# Patient Record
Sex: Female | Born: 2000 | Race: White | Hispanic: No | Marital: Married | Smoking: Never smoker
Health system: Southern US, Community
[De-identification: ages and names within clinical notes are randomized; demographics above are authoritative.]

## PROBLEM LIST (undated history)

## (undated) ENCOUNTER — Inpatient Hospital Stay (HOSPITAL_COMMUNITY): Payer: Self-pay

## (undated) ENCOUNTER — Inpatient Hospital Stay: Payer: Self-pay

## (undated) DIAGNOSIS — E119 Type 2 diabetes mellitus without complications: Secondary | ICD-10-CM

## (undated) DIAGNOSIS — R718 Other abnormality of red blood cells: Secondary | ICD-10-CM

## (undated) DIAGNOSIS — O24419 Gestational diabetes mellitus in pregnancy, unspecified control: Secondary | ICD-10-CM

## (undated) DIAGNOSIS — J302 Other seasonal allergic rhinitis: Secondary | ICD-10-CM

## (undated) DIAGNOSIS — Z5189 Encounter for other specified aftercare: Secondary | ICD-10-CM

## (undated) DIAGNOSIS — Z789 Other specified health status: Secondary | ICD-10-CM

## (undated) DIAGNOSIS — O09299 Supervision of pregnancy with other poor reproductive or obstetric history, unspecified trimester: Secondary | ICD-10-CM

## (undated) HISTORY — PX: OTHER SURGICAL HISTORY: SHX169

## (undated) HISTORY — DX: Gestational diabetes mellitus in pregnancy, unspecified control: O24.419

## (undated) HISTORY — DX: Other specified health status: Z78.9

## (undated) HISTORY — DX: Type 2 diabetes mellitus without complications: E11.9

## (undated) HISTORY — DX: Supervision of pregnancy with other poor reproductive or obstetric history, unspecified trimester: O09.299

## (undated) HISTORY — DX: Other abnormality of red blood cells: R71.8

## (undated) HISTORY — PX: NO PAST SURGERIES: SHX2092

## (undated) HISTORY — DX: Encounter for other specified aftercare: Z51.89

---
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
## 2019-01-05 ENCOUNTER — Ambulatory Visit: Payer: Medicaid Other | Admitting: Women's Health

## 2019-03-30 ENCOUNTER — Encounter: Payer: Self-pay | Admitting: Emergency Medicine

## 2019-03-30 ENCOUNTER — Other Ambulatory Visit: Payer: Medicaid Other

## 2019-03-30 ENCOUNTER — Telehealth: Payer: Self-pay | Admitting: Obstetrics and Gynecology

## 2019-03-30 ENCOUNTER — Emergency Department
Admission: EM | Admit: 2019-03-30 | Discharge: 2019-03-30 | Disposition: A | Discharge status: Home / Self Care | Payer: Medicaid Other | Attending: Emergency Medicine | Admitting: Emergency Medicine

## 2019-03-30 ENCOUNTER — Other Ambulatory Visit: Payer: Self-pay

## 2019-03-30 ENCOUNTER — Telehealth: Payer: Self-pay | Admitting: *Deleted

## 2019-03-30 DIAGNOSIS — Z7722 Contact with and (suspected) exposure to environmental tobacco smoke (acute) (chronic): Secondary | ICD-10-CM | POA: Insufficient documentation

## 2019-03-30 DIAGNOSIS — R300 Dysuria: Secondary | ICD-10-CM | POA: Diagnosis not present

## 2019-03-30 DIAGNOSIS — R350 Frequency of micturition: Secondary | ICD-10-CM | POA: Diagnosis present

## 2019-03-30 DIAGNOSIS — Z79899 Other long term (current) drug therapy: Secondary | ICD-10-CM | POA: Insufficient documentation

## 2019-03-30 DIAGNOSIS — N390 Urinary tract infection, site not specified: Secondary | ICD-10-CM

## 2019-03-30 LAB — URINALYSIS, COMPLETE (UACMP) WITH MICROSCOPIC
Bacteria, UA: NONE SEEN
Bilirubin Urine: NEGATIVE
Glucose, UA: NEGATIVE mg/dL
Ketones, ur: NEGATIVE mg/dL
Nitrite: NEGATIVE
Protein, ur: 100 mg/dL — AB
RBC / HPF: >50 RBC/hpf — ABNORMAL HIGH (ref 0–5)
Specific Gravity, Urine: 1.03 (ref 1.005–1.030)
WBC, UA: >50 WBC/hpf — ABNORMAL HIGH (ref 0–5)
pH: 6 (ref 5.0–8.0)

## 2019-03-30 MED ORDER — CEPHALEXIN 500 MG PO CAPS: 500.0000 mg | ORAL_CAPSULE | Freq: Once | ORAL | Status: DC

## 2019-03-30 MED ORDER — CEPHALEXIN 500 MG PO CAPS: 500.0000 mg | ORAL_CAPSULE | Freq: Two times a day (BID) | ORAL | 0 refills | Status: AC

## 2019-03-30 NOTE — ED Provider Notes (Signed)
[facility] Emergency Department Provider Note  ____________________________________________  Time seen: Approximately 1:51 PM  I have reviewed the triage vital signs and the nursing notes.   HISTORY  Chief Complaint Urinary Frequency and Dysuria    HPI Ann Hopkins is a 18 y.o. female that presents to the emergency department for evaluation of urinary urgency, frequency, dysuria for 2 days.  Patient states that she has a history of urinary tract infections and this feels the same.  She is currently on her menstrual cycle.  She is having some of her normal menstrual abdominal cramping.  No fevers.  No vaginal discharge.   Past Medical History:  Diagnosis Date  . Medical history non-contributory   . Seasonal allergies     Patient Active Problem List   Diagnosis Date Noted  . Normal labor 07/10/2018  . Indication for care in labor and delivery, antepartum 07/03/2018  . Back pain affecting pregnancy in third trimester 06/29/2018  . Rh negative state in antepartum period 12/10/2017  . Asymptomatic bacteriuria during pregnancy in first trimester 12/08/2017    Past Surgical History:  Procedure Laterality Date  . childbirth    . NO PAST SURGERIES      Prior to Admission medications   Medication Sig Start Date End Date Taking? Authorizing Provider  cephALEXin (KEFLEX) 500 MG capsule Take 1 capsule (500 mg total) by mouth 2 (two) times daily for 7 days. 03/30/19 04/06/19  Laban Emperor, PA-C  megestrol (MEGACE) 40 MG tablet 3 tablets a day for 5 days, 2 tablets a day for 5 days then 1 tablet daily 12/08/18   Florian Buff, MD    Allergies Latex  Family History  Problem Relation Age of Onset  . Cancer Other   . Cancer Maternal Grandfather   . Diabetes Maternal Grandfather     Social History Social History   Tobacco Use  . Smoking status: Passive Smoke Exposure - Never Smoker  . Smokeless tobacco: Never Used  Substance Use Topics  .  Alcohol use: No  . Drug use: No     Review of Systems  Constitutional: No fever/chills Respiratory: No SOB. Gastrointestinal: No nausea, no vomiting.  Genitourinary: Positive for dysuria. Musculoskeletal: Negative for musculoskeletal pain. Skin: Negative for rash, abrasions, lacerations, ecchymosis.   ____________________________________________   PHYSICAL EXAM:  VITAL SIGNS: ED Triage Vitals  Enc Vitals Group     BP 03/30/19 1243 115/63     Pulse Rate 03/30/19 1243 85     Resp 03/30/19 1243 20     Temp 03/30/19 1243 98 F (36.7 C)     Temp Source 03/30/19 1243 Oral     SpO2 03/30/19 1243 100 %     Weight 03/30/19 1244 126 lb (57.2 kg)     Height 03/30/19 1244 5' (1.524 m)     Head Circumference --      Peak Flow --      Pain Score 03/30/19 1244 0     Pain Loc --      Pain Edu? --      Excl. in [place]? --      Constitutional: Alert and oriented. Well appearing and in no acute distress. Eyes: Conjunctivae are normal. PERRL. EOMI. Head: Atraumatic. ENT:      Ears:      Nose: No congestion/rhinnorhea.      Mouth/Throat: Mucous membranes are moist.  Neck: No stridor. Cardiovascular: Normal rate, regular rhythm.  Good peripheral circulation. Respiratory: Normal respiratory effort without  tachypnea or retractions. Lungs CTAB. Good air entry to the bases with no decreased or absent breath sounds. Musculoskeletal: Full range of motion to all extremities. No gross deformities appreciated. Neurologic:  Normal speech and language. No gross focal neurologic deficits are appreciated.  Skin:  Skin is warm, dry and intact. No rash noted. Psychiatric: Mood and affect are normal. Speech and behavior are normal. Patient exhibits appropriate insight and judgement.   ____________________________________________   LABS (all labs ordered are listed, but only abnormal results are displayed)  Labs Reviewed  URINALYSIS, COMPLETE (UACMP) WITH MICROSCOPIC - Abnormal; Notable for the  following components:      Result Value   Color, Urine YELLOW (*)    APPearance HAZY (*)    Hgb urine dipstick LARGE (*)    Protein, ur 100 (*)    Leukocytes,Ua MODERATE (*)    RBC / HPF >50 (*)    WBC, UA >50 (*)    All other components within normal limits   ____________________________________________  EKG   ____________________________________________  RADIOLOGY   No results found.  ____________________________________________    PROCEDURES  Procedure(s) performed:    Procedures    Medications  cephALEXin (KEFLEX) capsule 500 mg (has no administration in time range)     ____________________________________________   INITIAL IMPRESSION / ASSESSMENT AND PLAN / ED COURSE  Pertinent labs & imaging results that were available during my care of the patient were reviewed by me and considered in my medical decision making (see chart for details).  Review of the [place] CSRS was performed in accordance of the [facility] prior to dispensing any controlled drugs.   Patient's diagnosis is consistent with urinary tract infection.  Urinalysis consistent with infection.  Patient will be discharged home with prescriptions for Keflex.  Patient is to follow up with primary care as directed. Patient is given ED precautions to return to the ED for any worsening or new symptoms.   [name] was evaluated in Emergency Department on 03/30/2019 for the symptoms described in the history of present illness. She was evaluated in the context of the global COVID-19 pandemic, which necessitated consideration that the patient might be at risk for infection with the SARS-CoV-2 virus that causes COVID-19. Institutional protocols and algorithms that pertain to the evaluation of patients at risk for COVID-19 are in a state of rapid change based on information released by regulatory bodies including the CDC and federal and state organizations. These policies and algorithms were followed during the  patient's care in the ED.  ____________________________________________  FINAL CLINICAL IMPRESSION(S) / ED DIAGNOSES  Final diagnoses:  Lower urinary tract infectious disease      NEW MEDICATIONS STARTED DURING THIS VISIT:  ED Discharge Orders         Ordered    cephALEXin (KEFLEX) 500 MG capsule  2 times daily     03/30/19 1352              This chart was dictated using voice recognition software/Dragon. Despite best efforts to proofread, errors can occur which can change the meaning. Any change was purely unintentional.    Enid Derry, PA-C 03/30/19 1454    Dionne Bucy, MD 03/30/19 1544

## 2019-03-30 NOTE — Telephone Encounter (Addendum)
Tried to call patient back. Voicemail is full.

## 2019-03-30 NOTE — Telephone Encounter (Signed)
Pt called and stated that she needs to be seen today. The patient stated that she has a UTI. Please advise.

## 2019-03-30 NOTE — Telephone Encounter (Signed)
Pt reports urinary frequency and burning.

## 2019-03-30 NOTE — ED Triage Notes (Signed)
Pt reports symptoms of a UTI since yesterday. Pt states has had one before and knows what it feels like.

## 2019-04-01 ENCOUNTER — Emergency Department (HOSPITAL_COMMUNITY)
Admission: EM | Admit: 2019-04-01 | Discharge: 2019-04-02 | Disposition: A | Discharge status: Home / Self Care | Payer: Medicaid Other | Attending: Emergency Medicine | Admitting: Emergency Medicine

## 2019-04-01 ENCOUNTER — Encounter (HOSPITAL_COMMUNITY): Payer: Self-pay | Admitting: Emergency Medicine

## 2019-04-01 ENCOUNTER — Other Ambulatory Visit: Payer: Self-pay

## 2019-04-01 DIAGNOSIS — Z7722 Contact with and (suspected) exposure to environmental tobacco smoke (acute) (chronic): Secondary | ICD-10-CM | POA: Diagnosis not present

## 2019-04-01 DIAGNOSIS — Z3202 Encounter for pregnancy test, result negative: Secondary | ICD-10-CM | POA: Diagnosis not present

## 2019-04-01 DIAGNOSIS — Z79899 Other long term (current) drug therapy: Secondary | ICD-10-CM | POA: Diagnosis not present

## 2019-04-01 DIAGNOSIS — R112 Nausea with vomiting, unspecified: Secondary | ICD-10-CM | POA: Insufficient documentation

## 2019-04-01 DIAGNOSIS — Z91040 Latex allergy status: Secondary | ICD-10-CM | POA: Diagnosis not present

## 2019-04-01 LAB — URINALYSIS, ROUTINE W REFLEX MICROSCOPIC
Bacteria, UA: NONE SEEN
Bilirubin Urine: NEGATIVE
Glucose, UA: NEGATIVE mg/dL
Hgb urine dipstick: NEGATIVE
Ketones, ur: NEGATIVE mg/dL
Nitrite: NEGATIVE
Protein, ur: NEGATIVE mg/dL
Specific Gravity, Urine: 1.027 (ref 1.005–1.030)
pH: 7 (ref 5.0–8.0)

## 2019-04-01 LAB — CBC
HCT: 41.7 % (ref 36.0–46.0)
Hemoglobin: 13.3 g/dL (ref 12.0–15.0)
MCH: 27.9 pg (ref 26.0–34.0)
MCHC: 31.9 g/dL (ref 30.0–36.0)
MCV: 87.4 fL (ref 80.0–100.0)
Platelets: 220 10*3/uL (ref 150–400)
RBC: 4.77 MIL/uL (ref 3.87–5.11)
RDW: 13 % (ref 11.5–15.5)
WBC: 12.7 10*3/uL — ABNORMAL HIGH (ref 4.0–10.5)
nRBC: 0 % (ref 0.0–0.2)

## 2019-04-01 LAB — COMPREHENSIVE METABOLIC PANEL
ALT: 12 U/L (ref 0–44)
AST: 16 U/L (ref 15–41)
Albumin: 4.3 g/dL (ref 3.5–5.0)
Alkaline Phosphatase: 59 U/L (ref 38–126)
Anion gap: 9 (ref 5–15)
BUN: 14 mg/dL (ref 6–20)
CO2: 21 mmol/L — ABNORMAL LOW (ref 22–32)
Calcium: 9 mg/dL (ref 8.9–10.3)
Chloride: 106 mmol/L (ref 98–111)
Creatinine, Ser: 0.48 mg/dL (ref 0.44–1.00)
GFR calc Af Amer: >60 mL/min (ref >60)
GFR calc non Af Amer: >60 mL/min (ref >60)
Glucose, Bld: 97 mg/dL (ref 70–99)
Potassium: 3.7 mmol/L (ref 3.5–5.1)
Sodium: 136 mmol/L (ref 135–145)
Total Bilirubin: 0.4 mg/dL (ref 0.3–1.2)
Total Protein: 7.9 g/dL (ref 6.5–8.1)

## 2019-04-01 LAB — LIPASE, BLOOD: Lipase: 27 U/L (ref 11–51)

## 2019-04-01 LAB — POC URINE PREG, ED: Preg Test, Ur: NEGATIVE

## 2019-04-01 MED ORDER — ONDANSETRON HCL 4 MG/2ML IJ SOLN
4.0000 mg | Freq: Once | INTRAMUSCULAR | Status: AC
  Administered 2019-04-01: 4 mg via INTRAVENOUS
  Filled 2019-04-01: qty 2

## 2019-04-01 MED ORDER — SODIUM CHLORIDE 0.9% FLUSH
3.0000 mL | Freq: Once | INTRAVENOUS | Status: AC
  Administered 2019-04-01: 3 mL via INTRAVENOUS

## 2019-04-01 NOTE — ED Provider Notes (Signed)
[facility] EMERGENCY DEPARTMENT Provider Note   CSN: [id number] Arrival date & time: 04/01/19  2053   Time seen 11:28 PM  History   Chief Complaint Chief Complaint  Patient presents with  . Emesis    HPI Ann Hopkins is a 18 y.o. female.     HPI was seen in the ED 2 days ago for a urinary tract infection and was started on cephalexin.  She states her symptoms are improving however today she ate at 6 AM and did not eat all day while she was taking her antibiotics.  She states about 8 PM she started getting nauseated and felt like she was going to pass out.  She states she vomited x3 without diarrhea.  She denies any abdominal pain.  She states her nausea is gone now and she actually ate some chicken tenders on the way to the ED.  She states today's the first day she did not eat while taking the antibiotics.  She denies any fever.  She actually states she feels good enough to be discharged home now.  Patient also states her symptoms she had with a urinary tract infection are improving.  PCP Patient, No Pcp Per   Past Medical History:  Diagnosis Date  . Medical history non-contributory   . Seasonal allergies     Patient Active Problem List   Diagnosis Date Noted  . Normal labor 07/10/2018  . Indication for care in labor and delivery, antepartum 07/03/2018  . Back pain affecting pregnancy in third trimester 06/29/2018  . Rh negative state in antepartum period 12/10/2017  . Asymptomatic bacteriuria during pregnancy in first trimester 12/08/2017    Past Surgical History:  Procedure Laterality Date  . childbirth    . NO PAST SURGERIES       OB History    Gravida  1   Para  1   Term  1   Preterm      AB      Living  1     SAB      TAB      Ectopic      Multiple  0   Live Births  1            Home Medications    Prior to Admission medications   Medication Sig Start Date End Date Taking? Authorizing Provider  cephALEXin (KEFLEX) 500 MG  capsule Take 1 capsule (500 mg total) by mouth 2 (two) times daily for 7 days. 03/30/19 04/06/19  Enid DerryWagner, Ashley, PA-C  megestrol (MEGACE) 40 MG tablet 3 tablets a day for 5 days, 2 tablets a day for 5 days then 1 tablet daily 12/08/18   Lazaro ArmsEure, Luther H, MD    Family History Family History  Problem Relation Age of Onset  . Cancer Other   . Cancer Maternal Grandfather   . Diabetes Maternal Grandfather     Social History Social History   Tobacco Use  . Smoking status: Passive Smoke Exposure - Never Smoker  . Smokeless tobacco: Never Used  Substance Use Topics  . Alcohol use: No  . Drug use: No  employed   Allergies   Latex   Review of Systems Review of Systems  All other systems reviewed and are negative.    Physical Exam Updated Vital Signs BP 116/68   Pulse 78   Temp 98.2 F (36.8 C) (Oral)   Resp 15   LMP 03/29/2019 (Exact Date)   SpO2 97%   Physical Exam  Vitals signs and nursing note reviewed.  Constitutional:      General: She is not in acute distress.    Appearance: Normal appearance. She is normal weight.  HENT:     Head: Normocephalic and atraumatic.     Right Ear: External ear normal.     Left Ear: External ear normal.     Mouth/Throat:     Mouth: Mucous membranes are moist.     Pharynx: No oropharyngeal exudate or posterior oropharyngeal erythema.  Eyes:     Extraocular Movements: Extraocular movements intact.     Conjunctiva/sclera: Conjunctivae normal.     Pupils: Pupils are equal, round, and reactive to light.  Neck:     Musculoskeletal: Normal range of motion.  Cardiovascular:     Rate and Rhythm: Normal rate.     Pulses: Normal pulses.     Heart sounds: Normal heart sounds.  Pulmonary:     Effort: Pulmonary effort is normal. No respiratory distress.     Breath sounds: No stridor. No wheezing, rhonchi or rales.  Abdominal:     General: Abdomen is flat. Bowel sounds are normal.     Palpations: Abdomen is soft.     Tenderness: There is no  abdominal tenderness. There is no guarding or rebound.  Musculoskeletal: Normal range of motion.        General: No deformity.  Skin:    General: Skin is warm and dry.     Findings: No erythema.  Neurological:     General: No focal deficit present.     Mental Status: She is alert and oriented to person, place, and time.     Cranial Nerves: No cranial nerve deficit.  Psychiatric:        Mood and Affect: Mood normal.        Behavior: Behavior normal.        Thought Content: Thought content normal.      ED Treatments / Results  Labs (all labs ordered are listed, but only abnormal results are displayed) Results for orders placed or performed during the hospital encounter of 04/01/19  Lipase, blood  Result Value Ref Range   Lipase 27 11 - 51 U/L  Comprehensive metabolic panel  Result Value Ref Range   Sodium 136 135 - 145 mmol/L   Potassium 3.7 3.5 - 5.1 mmol/L   Chloride 106 98 - 111 mmol/L   CO2 21 (L) 22 - 32 mmol/L   Glucose, Bld 97 70 - 99 mg/dL   BUN 14 6 - 20 mg/dL   Creatinine, Ser 0.48 0.44 - 1.00 mg/dL   Calcium 9.0 8.9 - 10.3 mg/dL   Total Protein 7.9 6.5 - 8.1 g/dL   Albumin 4.3 3.5 - 5.0 g/dL   AST 16 15 - 41 U/L   ALT 12 0 - 44 U/L   Alkaline Phosphatase 59 38 - 126 U/L   Total Bilirubin 0.4 0.3 - 1.2 mg/dL   GFR calc non Af Amer >60 >60 mL/min   GFR calc Af Amer >60 >60 mL/min   Anion gap 9 5 - 15  CBC  Result Value Ref Range   WBC 12.7 (H) 4.0 - 10.5 K/uL   RBC 4.77 3.87 - 5.11 MIL/uL   Hemoglobin 13.3 12.0 - 15.0 g/dL   HCT 41.7 36.0 - 46.0 %   MCV 87.4 80.0 - 100.0 fL   MCH 27.9 26.0 - 34.0 pg   MCHC 31.9 30.0 - 36.0 g/dL   RDW 13.0 11.5 -  15.5 %   Platelets 220 150 - 400 K/uL   nRBC 0.0 0.0 - 0.2 %  Urinalysis, Routine w reflex microscopic  Result Value Ref Range   Color, Urine YELLOW YELLOW   APPearance HAZY (A) CLEAR   Specific Gravity, Urine 1.027 1.005 - 1.030   pH 7.0 5.0 - 8.0   Glucose, UA NEGATIVE NEGATIVE mg/dL   Hgb urine dipstick  NEGATIVE NEGATIVE   Bilirubin Urine NEGATIVE NEGATIVE   Ketones, ur NEGATIVE NEGATIVE mg/dL   Protein, ur NEGATIVE NEGATIVE mg/dL   Nitrite NEGATIVE NEGATIVE   Leukocytes,Ua TRACE (A) NEGATIVE   RBC / HPF 0-5 0 - 5 RBC/hpf   WBC, UA 11-20 0 - 5 WBC/hpf   Bacteria, UA NONE SEEN NONE SEEN   Squamous Epithelial / LPF 11-20 0 - 5   Mucus PRESENT   POC urine preg, ED  Result Value Ref Range   Preg Test, Ur NEGATIVE NEGATIVE   Laboratory interpretation all normal except leukocytosis consistent with history of vomiting, improving urinalysis results compared to 2 days ago.    EKG None  Radiology No results found.  Procedures Procedures (including critical care time)  Medications Ordered in ED Medications  sodium chloride flush (NS) 0.9 % injection 3 mL (3 mLs Intravenous Given 04/01/19 2247)  ondansetron (ZOFRAN) injection 4 mg (4 mg Intravenous Given 04/01/19 2251)     Initial Impression / Assessment and Plan / ED Course  I have reviewed the triage vital signs and the nursing notes.  Pertinent labs & imaging results that were available during my care of the patient were reviewed by me and considered in my medical decision making (see chart for details).      Patient presents with nausea and vomiting while taking antibiotics on an empty stomach all day.  She states she was able to eat on the way to the ED and had received Zofran IV in the ED.  She states she feels fine now and does not want to get IV fluids.  She states she feels like she is able to orally drink fluids on her own.  She feels like she is able to go home now.  We discussed her test results which were very reassuring and show that her UTI symptoms and her urinalysis results are improving on antibiotics.   Final Clinical Impressions(s) / ED Diagnoses   Final diagnoses:  Non-intractable vomiting with nausea, unspecified vomiting type    ED Discharge Orders    None     Plan discharge  Devoria Albe, MD, Concha Pyo, MD 04/01/19 [phone]

## 2019-04-01 NOTE — Discharge Instructions (Addendum)
Drink plenty of fluids. Try to eat something when you take your antibiotics. Finish your antibiotics. Recheck if you get worse.

## 2019-04-01 NOTE — ED Triage Notes (Signed)
Pt C/O lightheadedness and emesis X 3 that started 2 hours ago. Pt denies pain.

## 2019-04-02 NOTE — ED Notes (Signed)
Pt ambulatory to waiting room. Pt verbalized understanding of discharge instructions.   

## 2019-05-03 ENCOUNTER — Encounter (HOSPITAL_COMMUNITY): Payer: Self-pay | Admitting: Emergency Medicine

## 2019-05-03 ENCOUNTER — Emergency Department (HOSPITAL_COMMUNITY)
Admission: EM | Admit: 2019-05-03 | Discharge: 2019-05-03 | Disposition: A | Discharge status: Home / Self Care | Payer: Medicaid Other | Attending: Emergency Medicine | Admitting: Emergency Medicine

## 2019-05-03 ENCOUNTER — Other Ambulatory Visit: Payer: Self-pay

## 2019-05-03 DIAGNOSIS — Z7722 Contact with and (suspected) exposure to environmental tobacco smoke (acute) (chronic): Secondary | ICD-10-CM | POA: Diagnosis not present

## 2019-05-03 DIAGNOSIS — R300 Dysuria: Secondary | ICD-10-CM | POA: Insufficient documentation

## 2019-05-03 DIAGNOSIS — R109 Unspecified abdominal pain: Secondary | ICD-10-CM | POA: Diagnosis present

## 2019-05-03 DIAGNOSIS — Z91040 Latex allergy status: Secondary | ICD-10-CM | POA: Insufficient documentation

## 2019-05-03 DIAGNOSIS — N12 Tubulo-interstitial nephritis, not specified as acute or chronic: Secondary | ICD-10-CM | POA: Diagnosis not present

## 2019-05-03 LAB — URINALYSIS, ROUTINE W REFLEX MICROSCOPIC
Bilirubin Urine: NEGATIVE
Glucose, UA: NEGATIVE mg/dL
Ketones, ur: NEGATIVE mg/dL
Nitrite: NEGATIVE
Protein, ur: 30 mg/dL — AB
RBC / HPF: >50 RBC/hpf — ABNORMAL HIGH (ref 0–5)
Specific Gravity, Urine: 1.015 (ref 1.005–1.030)
WBC, UA: >50 WBC/hpf — ABNORMAL HIGH (ref 0–5)
pH: 6 (ref 5.0–8.0)

## 2019-05-03 LAB — POC URINE PREG, ED: Preg Test, Ur: NEGATIVE

## 2019-05-03 MED ORDER — CEPHALEXIN 500 MG PO CAPS: 500.0000 mg | ORAL_CAPSULE | Freq: Three times a day (TID) | ORAL | 0 refills | Status: DC

## 2019-05-03 MED ORDER — NAPROXEN 500 MG PO TABS: 500.0000 mg | ORAL_TABLET | Freq: Two times a day (BID) | ORAL | 0 refills | Status: DC

## 2019-05-03 NOTE — ED Provider Notes (Signed)
[facility] EMERGENCY DEPARTMENT Provider Note   CSN: [id number] Arrival date & time: 05/03/19  1015     History   Chief Complaint Chief Complaint  Patient presents with  . Flank Pain    HPI Ann Hopkins is a 18 y.o. female who presents with right flank pain.  Patient states that she was diagnosed with a UTI on October 27 when she went to [facility].  She was prescribed Keflex. She came back to the ED on Oct 29th after vomiting and this was felt to be related to not taking the medication with food. Soon after she lost the medication in a move and the dysuria returned and been persistent for the past month.  Last night she started to develop right-sided flank pain.  Pain is worsened today she was never able to work therefore she came to the ED.  She denies fever, chills, chest pain, shortness of breath, cough, nausea, vomiting, diarrhea.   HPI  Past Medical History:  Diagnosis Date  . Medical history non-contributory   . Seasonal allergies     Patient Active Problem List   Diagnosis Date Noted  . Normal labor 07/10/2018  . Indication for care in labor and delivery, antepartum 07/03/2018  . Back pain affecting pregnancy in third trimester 06/29/2018  . Rh negative state in antepartum period 12/10/2017  . Asymptomatic bacteriuria during pregnancy in first trimester 12/08/2017    Past Surgical History:  Procedure Laterality Date  . childbirth    . NO PAST SURGERIES       OB History    Gravida  1   Para  1   Term  1   Preterm      AB      Living  1     SAB      TAB      Ectopic      Multiple  0   Live Births  1            Home Medications    Prior to Admission medications   Medication Sig Start Date End Date Taking? Authorizing Provider  megestrol (MEGACE) 40 MG tablet 3 tablets a day for 5 days, 2 tablets a day for 5 days then 1 tablet daily 12/08/18   Lazaro Arms, MD    Family History Family History  Problem Relation  Age of Onset  . Cancer Other   . Cancer Maternal Grandfather   . Diabetes Maternal Grandfather     Social History Social History   Tobacco Use  . Smoking status: Passive Smoke Exposure - Never Smoker  . Smokeless tobacco: Never Used  Substance Use Topics  . Alcohol use: No  . Drug use: No     Allergies   Latex   Review of Systems Review of Systems  Constitutional: Negative for chills and fever.  Gastrointestinal: Negative for abdominal pain, diarrhea, nausea and vomiting.  Genitourinary: Positive for dysuria and flank pain. Negative for hematuria and pelvic pain.  All other systems reviewed and are negative.    Physical Exam Updated Vital Signs BP 118/69 (BP Location: Right Arm)   Pulse 100   Temp 98.4 F (36.9 C) (Oral)   Resp 16   Ht 5' (1.524 m)   Wt 54.4 kg   LMP 04/25/2019   SpO2 98%   BMI 23.44 kg/m   Physical Exam Vitals signs and nursing note reviewed.  Constitutional:      General: She is not in  acute distress.    Appearance: Normal appearance. She is well-developed. She is not ill-appearing.  HENT:     Head: Normocephalic and atraumatic.  Eyes:     General: No scleral icterus.       Right eye: No discharge.        Left eye: No discharge.     Conjunctiva/sclera: Conjunctivae normal.     Pupils: Pupils are equal, round, and reactive to light.  Neck:     Musculoskeletal: Normal range of motion.  Cardiovascular:     Rate and Rhythm: Normal rate and regular rhythm.  Pulmonary:     Effort: Pulmonary effort is normal. No respiratory distress.     Breath sounds: Normal breath sounds.  Abdominal:     General: There is no distension.     Palpations: Abdomen is soft.     Tenderness: There is abdominal tenderness (Right lower quadrant tenderness). There is right CVA tenderness.  Skin:    General: Skin is warm and dry.  Neurological:     Mental Status: She is alert and oriented to person, place, and time.  Psychiatric:        Behavior: Behavior  normal.      ED Treatments / Results  Labs (all labs ordered are listed, but only abnormal results are displayed) Labs Reviewed  URINALYSIS, ROUTINE W REFLEX MICROSCOPIC - Abnormal; Notable for the following components:      Result Value   APPearance HAZY (*)    Hgb urine dipstick MODERATE (*)    Protein, ur 30 (*)    Leukocytes,Ua LARGE (*)    RBC / HPF >50 (*)    WBC, UA >50 (*)    Bacteria, UA RARE (*)    All other components within normal limits  URINE CULTURE  POC URINE PREG, ED    EKG None  Radiology No results found.  Procedures Procedures (including critical care time)  Medications Ordered in ED Medications - No data to display   Initial Impression / Assessment and Plan / ED Course  I have reviewed the triage vital signs and the nursing notes.  Pertinent labs & imaging results that were available during my care of the patient were reviewed by me and considered in my medical decision making (see chart for details).  18 year old female presents with urinary symptoms for a month and now right flank pain after inadequate treatment for UTI approximately 1 month.  She is initially tachycardic however this is resolved on recheck.  She has right CVA tenderness and right lower quadrant tenderness on exam.  UA shows large leukocytes, over 50 red blood cells, over 50 white blood cells, rare bacteria.  We will send off urine culture.  Will treat for pyelonephritis.  Low suspicion for PID or appendicitis at this time with clear UTI symptoms.  She was given return precautions.  Final Clinical Impressions(s) / ED Diagnoses   Final diagnoses:  Pyelonephritis    ED Discharge Orders    None       Recardo Evangelist, PA-C 05/03/19 1434    Fredia Sorrow, MD 05/05/19 [phone]

## 2019-05-03 NOTE — Discharge Instructions (Signed)
Take Keflex three times daily for 10 days Take Naproxen for pain  Please return if you are worsening

## 2019-05-03 NOTE — ED Triage Notes (Signed)
Pt states she had uti several weeks ago and lost her abx. Started having right flank pain with uti s/s last night. Denies fever.

## 2019-05-06 LAB — URINE CULTURE
Culture: >=100000 — AB
Special Requests: NORMAL

## 2019-05-07 ENCOUNTER — Telehealth: Payer: Self-pay | Admitting: *Deleted

## 2019-05-07 NOTE — Telephone Encounter (Signed)
Post ED Visit - Positive Culture Follow-up: Successful Patient Follow-Up  Culture assessed and recommendations reviewed by:  []  Elenor Quinones, Pharm.D. []  Heide Guile, Pharm.D., BCPS AQ-ID []  Parks Neptune, Pharm.D., BCPS []  Alycia Rossetti, Pharm.D., BCPS []  [place], Pharm.D., BCPS, AAHIVP []  Legrand Como, Pharm.D., BCPS, AAHIVP []  Salome Arnt, PharmD, BCPS []  Johnnette Gourd, PharmD, BCPS []  Hughes Better, PharmD, BCPS []  Leeroy Cha, PharmD Gorden Harms, Pharm D  Positive urine culture  []  Patient discharged without antimicrobial prescription and treatment is now indicated [x]  Organism is resistant to prescribed ED discharge antimicrobial []  Patient with positive blood cultures  Changes discussed with ED provider Benedetto Goad, PA-C New antibiotic prescription Bactrim DS BID x 10 days Called to [place], [place]  Contacted patient, date 05/07/2019, time [place], [place] 05/07/2019, 10:22 AM

## 2019-05-07 NOTE — Progress Notes (Signed)
ED Antimicrobial Stewardship Positive Culture Follow Up   Ann Hopkins is an 18 y.o. female who presented to [facility] on 05/03/2019 with a chief complaint of  Chief Complaint  Patient presents with  . Flank Pain   Presenting with R flank pain. Recently treated with keflex for UTI on Oct 27. Still having continued dysuria. UA showing large leukocytes, neg nitrite, WBC >50, and rare bacteria.   Recent Results (from the past 720 hour(s))  Urine culture     Status: Abnormal   Collection Time: 05/03/19 10:44 AM   Specimen: Urine, Random  Result Value Ref Range Status   Specimen Description   Final    URINE, RANDOM Performed at [facility], [address] [place], [place] [address]    Special Requests   Final    Normal Performed at [facility], [address], [place], [place] [address]    Culture >=100,000 COLONIES/mL STAPHYLOCOCCUS SAPROPHYTICUS (A)  Final   Report Status 05/06/2019 FINAL  Final   Organism ID, Bacteria STAPHYLOCOCCUS SAPROPHYTICUS (A)  Final      Susceptibility   Staphylococcus saprophyticus - MIC*    CIPROFLOXACIN <=0.5 SENSITIVE Sensitive     GENTAMICIN <=0.5 SENSITIVE Sensitive     NITROFURANTOIN <=16 SENSITIVE Sensitive     OXACILLIN 2 RESISTANT Resistant     TETRACYCLINE <=1 SENSITIVE Sensitive     VANCOMYCIN <=0.5 SENSITIVE Sensitive     TRIMETH/SULFA <=10 SENSITIVE Sensitive     CLINDAMYCIN <=0.25 SENSITIVE Sensitive     RIFAMPIN <=0.5 SENSITIVE Sensitive     Inducible Clindamycin NEGATIVE Sensitive     * >=100,000 COLONIES/mL STAPHYLOCOCCUS SAPROPHYTICUS    [x]  Treated with cephalexin, organism resistant to prescribed antimicrobial []  Patient discharged originally without antimicrobial agent and treatment is now indicated  New antibiotic prescription: Stop cephalexin and change to Bactrim 1 double-strength (400-80 mg) twice daily for 10 days.   ED Provider: Benedetto Goad, PA-C   Antonietta Jewel, PharmD, BCCCP Clinical Pharmacist  Phone:  [phone]  Please check AMION for all [place] phone numbers After 10:00 PM, call [place] [phone] 05/07/2019, 9:28 AM Clinical Pharmacist Monday - Friday phone -  [phone] Saturday - Sunday phone - [phone]

## 2019-05-07 NOTE — Telephone Encounter (Signed)
Post ED Visit - Positive Culture Follow-up: Unsuccessful Patient Follow-up  Culture assessed and recommendations reviewed by:  []  Elenor Quinones, Pharm.D. []  Heide Guile, Pharm.D., BCPS AQ-ID []  Parks Neptune, Pharm.D., BCPS []  Alycia Rossetti, Pharm.D., BCPS []  Firth, Pharm.D., BCPS, AAHIVP []  Legrand Como, Pharm.D., BCPS, AAHIVP []  Wynell Balloon, PharmD []  Vincenza Hews, PharmD, BCPS Gorden Harms, PharmD  Positive urine culture  []  Patient discharged without antimicrobial prescription and treatment is now indicated [x]  Organism is resistant to prescribed ED discharge antimicrobial []  Patient with positive blood cultures  Plan: Stop Cephalexin, Start Bactrim DS BID x 10 days Unable to contact patient after 3 attempts, letter will be sent to address on file  Ardeen Fillers 05/07/2019, 9:57 AM

## 2019-06-28 IMAGING — DX DG CHEST 2V
2 series · 2 of 2 positions shown · non-contrast
Comparison: Chest radiograph dated 02/19/2015

CLINICAL DATA: 16-year-old female with cough.

EXAM:
CHEST  2 VIEW

[chest pa]
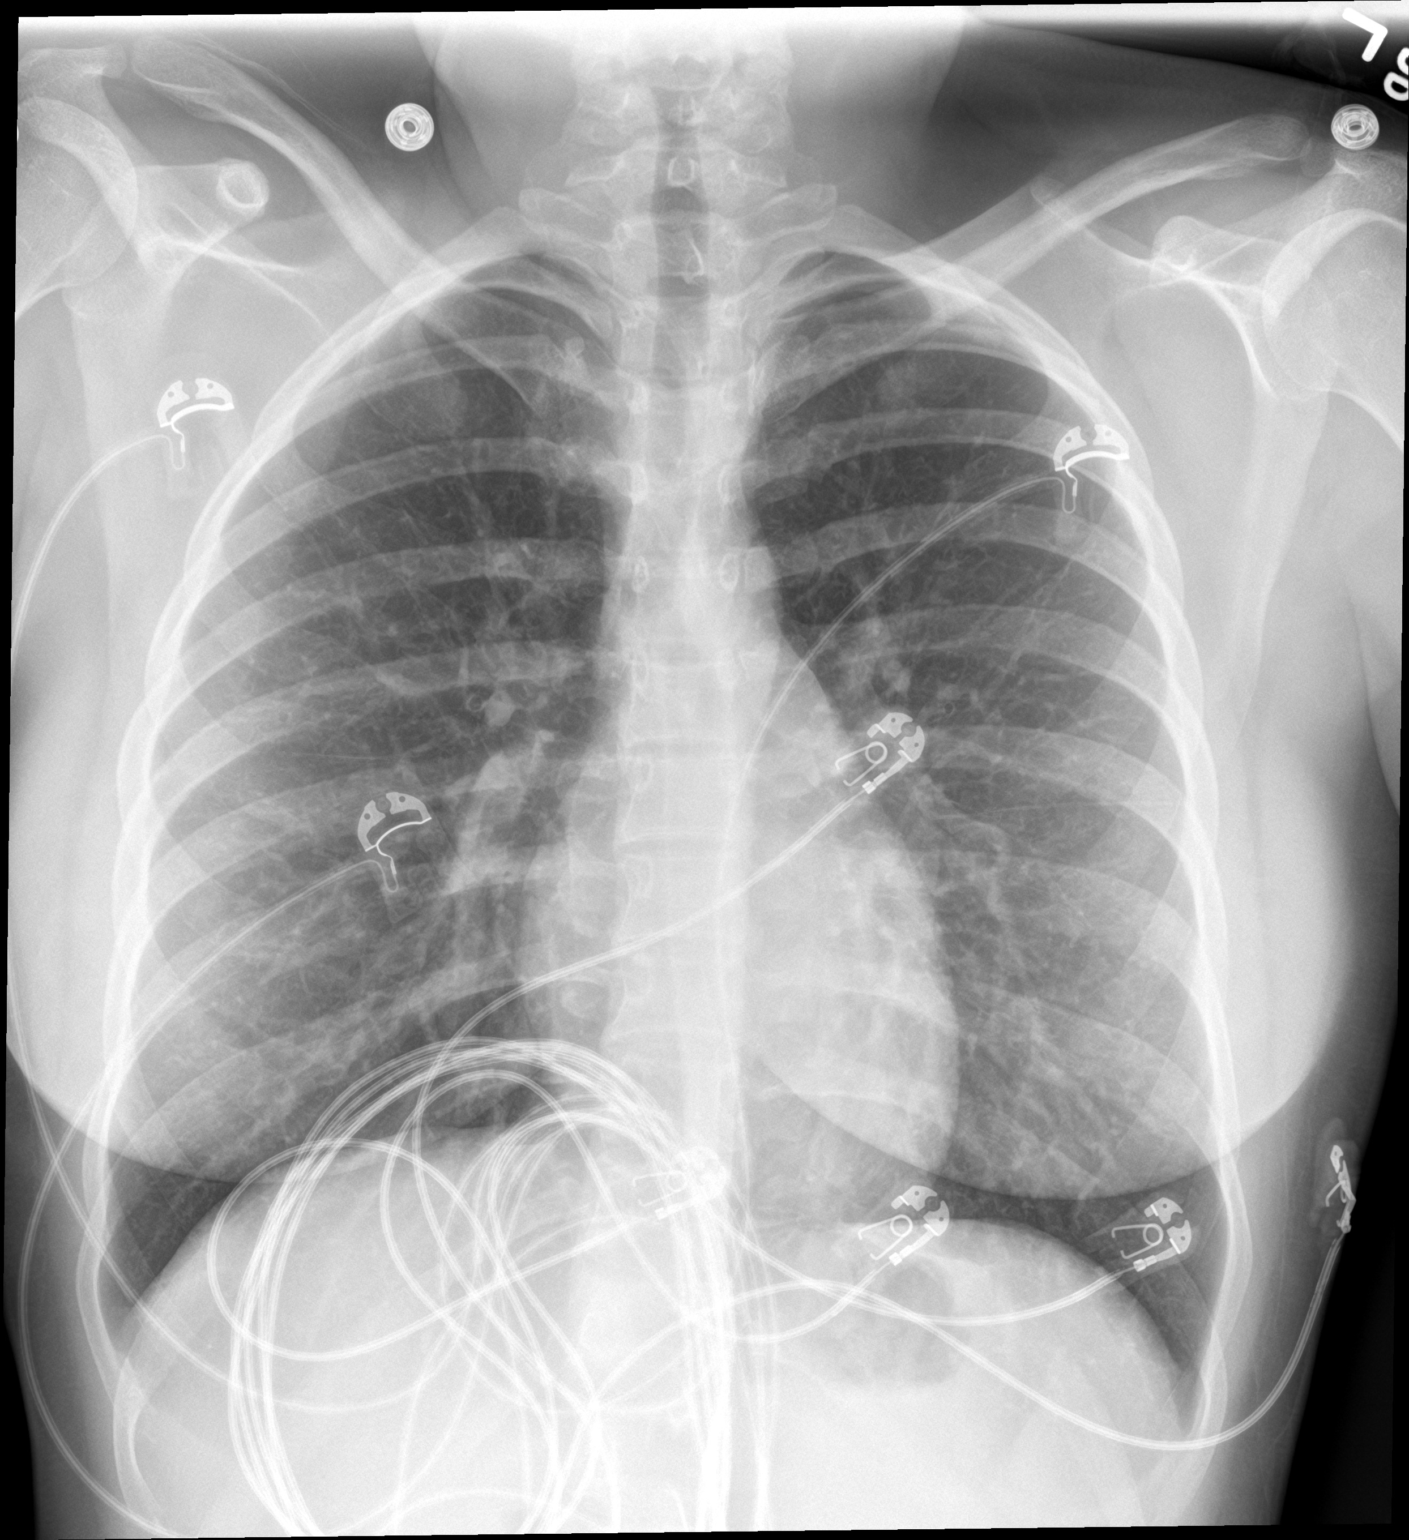

[chest lat]
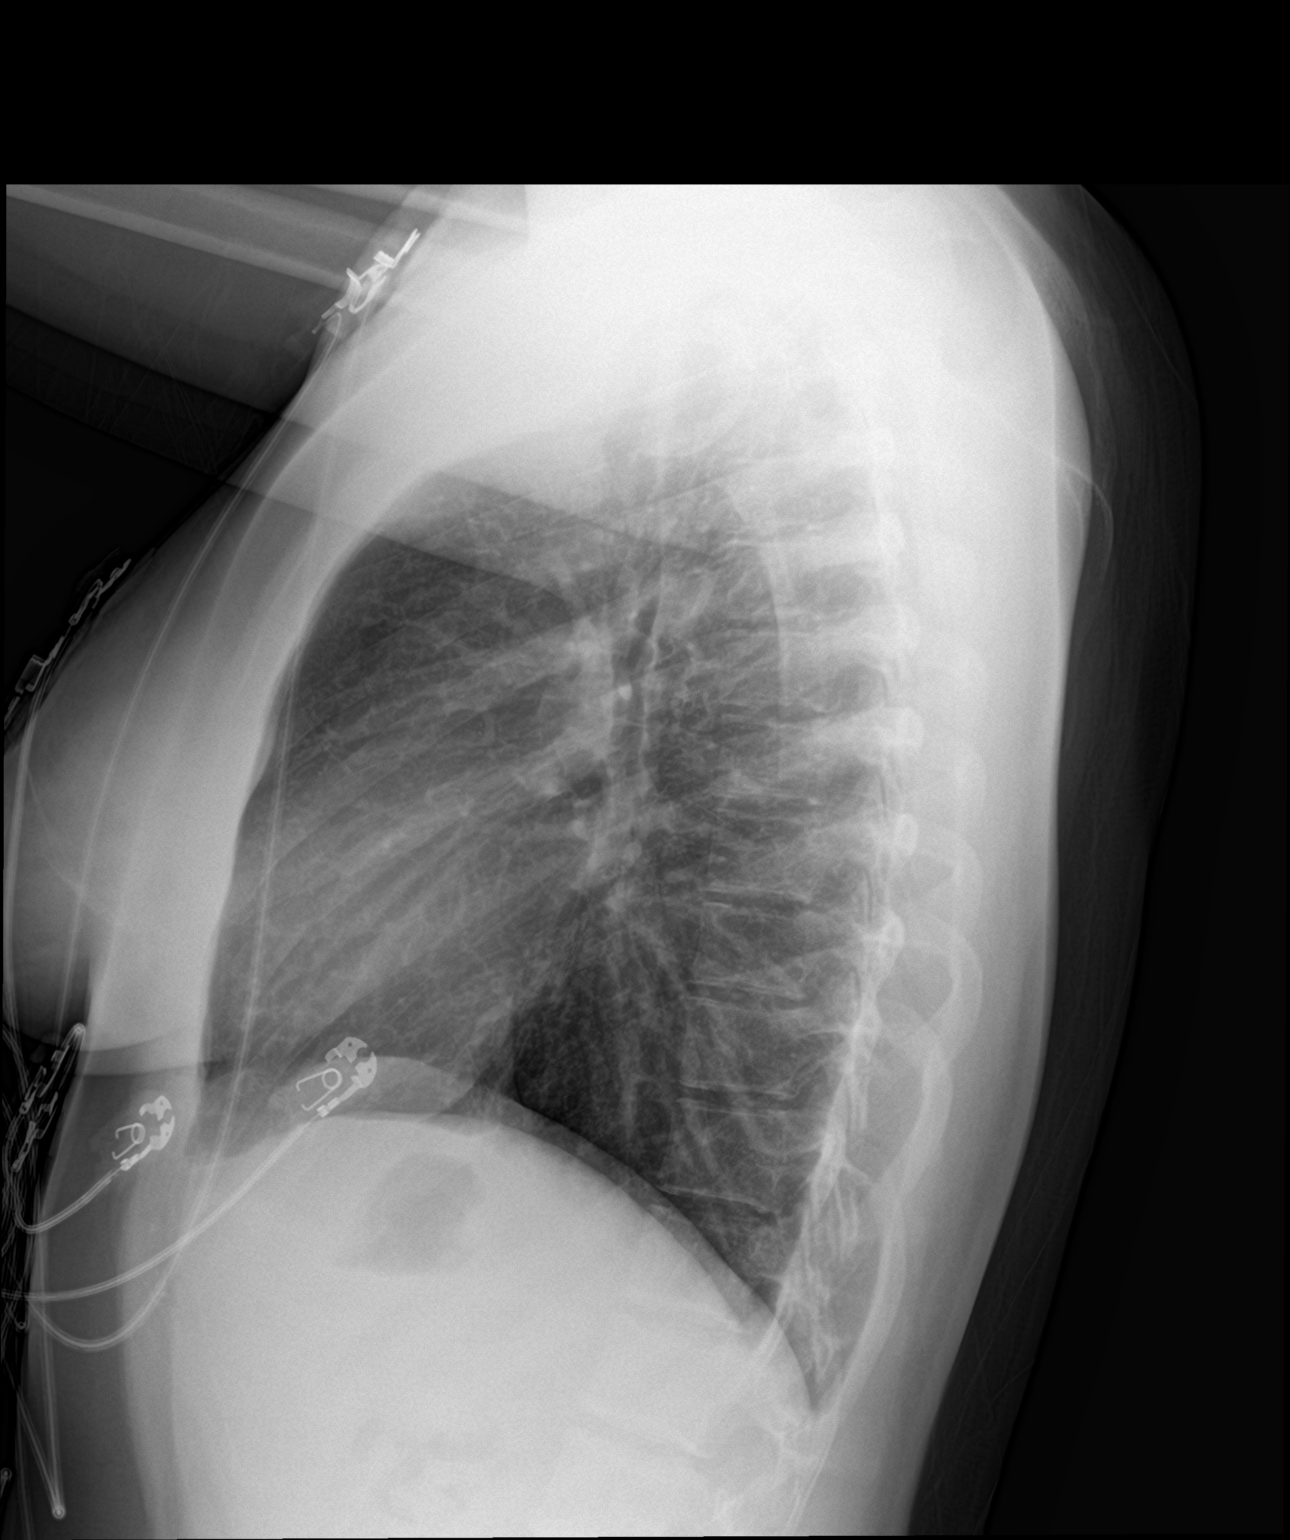

[2 of 2 positions shown; findings below may reference images not displayed]

FINDINGS: The heart size and mediastinal contours are within normal limits.
Both lungs are clear. The visualized skeletal structures are
unremarkable.
IMPRESSION: No active cardiopulmonary disease.

## 2019-11-10 ENCOUNTER — Other Ambulatory Visit: Payer: Self-pay

## 2019-11-10 ENCOUNTER — Encounter (HOSPITAL_COMMUNITY): Payer: Self-pay | Admitting: Emergency Medicine

## 2019-11-10 ENCOUNTER — Emergency Department (HOSPITAL_COMMUNITY)
Admission: EM | Admit: 2019-11-10 | Discharge: 2019-11-10 | Disposition: A | Discharge status: Home / Self Care | Payer: Medicaid Other | Attending: Emergency Medicine | Admitting: Emergency Medicine

## 2019-11-10 ENCOUNTER — Emergency Department (HOSPITAL_COMMUNITY): Payer: Medicaid Other

## 2019-11-10 DIAGNOSIS — Z7722 Contact with and (suspected) exposure to environmental tobacco smoke (acute) (chronic): Secondary | ICD-10-CM | POA: Insufficient documentation

## 2019-11-10 DIAGNOSIS — Z79899 Other long term (current) drug therapy: Secondary | ICD-10-CM | POA: Insufficient documentation

## 2019-11-10 DIAGNOSIS — Z91040 Latex allergy status: Secondary | ICD-10-CM | POA: Diagnosis not present

## 2019-11-10 DIAGNOSIS — R102 Pelvic and perineal pain: Secondary | ICD-10-CM | POA: Insufficient documentation

## 2019-11-10 DIAGNOSIS — R52 Pain, unspecified: Secondary | ICD-10-CM

## 2019-11-10 LAB — CBC WITH DIFFERENTIAL/PLATELET
Abs Immature Granulocytes: 0.03 10*3/uL (ref 0.00–0.07)
Basophils Absolute: 0 10*3/uL (ref 0.0–0.1)
Basophils Relative: 0 %
Eosinophils Absolute: 0.2 10*3/uL (ref 0.0–0.5)
Eosinophils Relative: 2 %
HCT: 42.3 % (ref 36.0–46.0)
Hemoglobin: 14.1 g/dL (ref 12.0–15.0)
Immature Granulocytes: 0 %
Lymphocytes Relative: 20 %
Lymphs Abs: 1.6 10*3/uL (ref 0.7–4.0)
MCH: 29.8 pg (ref 26.0–34.0)
MCHC: 33.3 g/dL (ref 30.0–36.0)
MCV: 89.4 fL (ref 80.0–100.0)
Monocytes Absolute: 0.6 10*3/uL (ref 0.1–1.0)
Monocytes Relative: 7 %
Neutro Abs: 5.8 10*3/uL (ref 1.7–7.7)
Neutrophils Relative %: 71 %
Platelets: 188 10*3/uL (ref 150–400)
RBC: 4.73 MIL/uL (ref 3.87–5.11)
RDW: 12 % (ref 11.5–15.5)
WBC: 8.3 10*3/uL (ref 4.0–10.5)
nRBC: 0 % (ref 0.0–0.2)

## 2019-11-10 LAB — COMPREHENSIVE METABOLIC PANEL
ALT: 12 U/L (ref 0–44)
AST: 14 U/L — ABNORMAL LOW (ref 15–41)
Albumin: 4.3 g/dL (ref 3.5–5.0)
Alkaline Phosphatase: 52 U/L (ref 38–126)
Anion gap: 7 (ref 5–15)
BUN: 14 mg/dL (ref 6–20)
CO2: 26 mmol/L (ref 22–32)
Calcium: 9.1 mg/dL (ref 8.9–10.3)
Chloride: 105 mmol/L (ref 98–111)
Creatinine, Ser: 0.76 mg/dL (ref 0.44–1.00)
GFR calc Af Amer: >60 mL/min (ref >60)
GFR calc non Af Amer: >60 mL/min (ref >60)
Glucose, Bld: 86 mg/dL (ref 70–99)
Potassium: 4.2 mmol/L (ref 3.5–5.1)
Sodium: 138 mmol/L (ref 135–145)
Total Bilirubin: 0.5 mg/dL (ref 0.3–1.2)
Total Protein: 7.5 g/dL (ref 6.5–8.1)

## 2019-11-10 LAB — I-STAT BETA HCG BLOOD, ED (MC, WL, AP ONLY): I-stat hCG, quantitative: <5 m[IU]/mL (ref <5)

## 2019-11-10 NOTE — ED Triage Notes (Signed)
Patient has abdominal pain, radiating to back, denies urinary symptoms, last pregnancy test negative, 3 days ago.

## 2019-11-10 NOTE — ED Notes (Signed)
Pt not in room when this nurse went in to discharge her.

## 2019-11-10 NOTE — Discharge Instructions (Signed)
Return to the ER for new or worsening symptoms. Repeat at home pregnancy test weekly until your cycle starts, follow up with your doctor for recheck.

## 2019-11-10 NOTE — ED Provider Notes (Signed)
[facility] EMERGENCY DEPARTMENT Provider Note   CSN: [id number] Arrival date & time: 11/10/19  1224     History Chief Complaint  Patient presents with  . Abdominal Pain    Ann Hopkins is a 19 y.o. female.  19 year old female presents with complaint of pelvic pain for the past 4 days, pain was intermittent at onset, has been constant for the past 2 days, radiates into her back, described as a cramping pain.  Denies changes in bowel or bladder habits or abnormal vaginal discharge.  Patient states she did have 1 episode of vomiting 2 days ago.  Patient reports similar symptoms when she found out she was pregnant with her first child, states she did not home pregnancy test which was negative.  Patient states her last menstrual cycle was 1 month ago, is late.  Denies history of gonorrhea or chlamydia.  No other complaints or concerns today.        Past Medical History:  Diagnosis Date  . Medical history non-contributory   . Seasonal allergies     Patient Active Problem List   Diagnosis Date Noted  . Normal labor 07/10/2018  . Indication for care in labor and delivery, antepartum 07/03/2018  . Back pain affecting pregnancy in third trimester 06/29/2018  . Rh negative state in antepartum period 12/10/2017  . Asymptomatic bacteriuria during pregnancy in first trimester 12/08/2017    Past Surgical History:  Procedure Laterality Date  . childbirth    . NO PAST SURGERIES       OB History    Gravida  1   Para  1   Term  1   Preterm      AB      Living  1     SAB      TAB      Ectopic      Multiple  0   Live Births  1           Family History  Problem Relation Age of Onset  . Cancer Other   . Cancer Maternal Grandfather   . Diabetes Maternal Grandfather     Social History   Tobacco Use  . Smoking status: Passive Smoke Exposure - Never Smoker  . Smokeless tobacco: Never Used  Substance Use Topics  . Alcohol use: No  . Drug use: No     Home Medications Prior to Admission medications   Medication Sig Start Date End Date Taking? Authorizing Provider  cephALEXin (KEFLEX) 500 MG capsule Take 1 capsule (500 mg total) by mouth 3 (three) times daily. 05/03/19   Bethel Born, PA-C  megestrol (MEGACE) 40 MG tablet 3 tablets a day for 5 days, 2 tablets a day for 5 days then 1 tablet daily 12/08/18   Lazaro Arms, MD  naproxen (NAPROSYN) 500 MG tablet Take 1 tablet (500 mg total) by mouth 2 (two) times daily. 05/03/19   Bethel Born, PA-C    Allergies    Latex  Review of Systems   Review of Systems  Constitutional: Negative for fever.  Respiratory: Negative for shortness of breath.   Cardiovascular: Negative for chest pain.  Gastrointestinal: Positive for abdominal pain, nausea and vomiting. Negative for constipation and diarrhea.  Genitourinary: Positive for pelvic pain. Negative for difficulty urinating, dysuria, vaginal bleeding and vaginal discharge.  Musculoskeletal: Positive for back pain.  Skin: Negative for rash and wound.  Allergic/Immunologic: Negative for immunocompromised state.  Neurological: Negative for weakness.    Physical  Exam Updated Vital Signs BP 118/76 (BP Location: Right Arm)   Pulse 80   Temp (!) 97 F (36.1 C) (Oral)   Resp 18   Ht 5' (1.524 m)   Wt 62.3 kg   LMP 10/02/2019   SpO2 98%   BMI 26.81 kg/m   Physical Exam Vitals and nursing note reviewed.  Constitutional:      General: She is not in acute distress.    Appearance: She is well-developed. She is not diaphoretic.  HENT:     Head: Normocephalic and atraumatic.  Cardiovascular:     Rate and Rhythm: Normal rate and regular rhythm.     Heart sounds: Normal heart sounds.  Pulmonary:     Effort: Pulmonary effort is normal.     Breath sounds: Normal breath sounds.  Abdominal:     Palpations: Abdomen is soft.     Tenderness: There is abdominal tenderness in the right lower quadrant and left lower quadrant.   Skin:    General: Skin is warm and dry.     Findings: No erythema or rash.  Neurological:     Mental Status: She is alert and oriented to person, place, and time.  Psychiatric:        Behavior: Behavior normal.     ED Results / Procedures / Treatments   Labs (all labs ordered are listed, but only abnormal results are displayed) Labs Reviewed  COMPREHENSIVE METABOLIC PANEL - Abnormal; Notable for the following components:      Result Value   AST 14 (*)    All other components within normal limits  CBC WITH DIFFERENTIAL/PLATELET  I-STAT BETA HCG BLOOD, ED ([facility], [facility], [facility] ONLY)    EKG None  Radiology US PELVIC COMPLETE W TRANSVAGINAL AND TORSION R/O  Result Date: 11/10/2019 CLINICAL DATA:  Pelvic pain for 3 days, LMP 10/02/2019, negative urinary pregnancy test 3 days ago EXAM: [place]: Both transabdominal and transvaginal ultrasound examinations of the pelvis were performed. Transabdominal technique was performed for global imaging of the pelvis including uterus, ovaries, adnexal regions, and pelvic cul-de-sac. It was necessary to proceed with endovaginal exam following the transabdominal exam to visualize the endometrium. Color and duplex Doppler ultrasound was utilized to evaluate blood flow to the ovaries. COMPARISON:  09/15/2018 FINDINGS: Uterus Measurements: 7.5 x 4.7 x 5.8 cm = volume: 108 mL. Retroverted. Normal morphology without mass. Endometrium Thickness: 10 mm. No endometrial fluid or focal abnormality. Small amount of nonspecific endocervical fluid noted. Right ovary Measurements: 3.6 x 1.7 x 3.4 cm = volume: 10.7 mL. Normal morphology without mass. Internal blood flow present on color Doppler imaging. Left ovary Measurements: 3.7 x 1.6 x 2.7 cm = volume: 8.7 mL. Normal morphology without mass. Internal blood flow present on color Doppler imaging. Pulsed Doppler evaluation of both ovaries  demonstrates normal low-resistance arterial and venous waveforms. Other findings No free pelvic fluid.  No adnexal masses. IMPRESSION: Normal exam. Electronically Signed   By: Lavonia Dana M.D.   On: 11/10/2019 15:16    Procedures Procedures (including critical care time)  Medications Ordered in ED Medications - No data to display  ED Course  I have reviewed the triage vital signs and the nursing notes.  Pertinent labs & imaging results that were available during my care of the patient were reviewed by me and considered in my medical decision making (see chart for details).  Clinical Course as of Nov 09 1533  Wed  Nov 10, 2019  2973 19 year old female presents with complaint of pelvic pain for the past few days, radiating to her back, concern for early pregnancy has similar symptoms to when she was pregnant.  Exam, patient has tenderness in the right and left lower quadrants, is otherwise well-appearing.  CBC, CMP within normal limits, hCG is negative.  Pelvic ultrasound without significant findings.  Patient declines pelvic exam today, states she needs to get to work.  Patient is in a monogamous relationship, no concerns for STDs.  Advised patient to return to ER for new or worsening symptoms otherwise follow-up with your PCP, recommend repeat at home pregnancy test 1 week if she has still not started her menstrual cycle.   [LM]    Clinical Course User Index [LM] Alden Hipp   MDM Rules/Calculators/A&P                      Final Clinical Impression(s) / ED Diagnoses Final diagnoses:  Pelvic pain in female    Rx / DC Orders ED Discharge Orders    None       Alden Hipp 11/10/19 1535    Eber Hong, MD 11/11/19 [phone]

## 2020-02-14 ENCOUNTER — Emergency Department
Admission: EM | Admit: 2020-02-14 | Discharge: 2020-02-14 | Discharge status: Against Medical Advice | Payer: Medicaid Other

## 2020-02-14 NOTE — ED Notes (Signed)
Pt called in the WR and outside the entrance with no response

## 2020-02-14 NOTE — ED Notes (Signed)
Pt called in the WR with no response 

## 2020-09-14 ENCOUNTER — Other Ambulatory Visit: Payer: Self-pay

## 2020-09-14 ENCOUNTER — Ambulatory Visit (INDEPENDENT_AMBULATORY_CARE_PROVIDER_SITE_OTHER): Payer: Medicaid Other | Admitting: Advanced Practice Midwife

## 2020-09-14 ENCOUNTER — Encounter: Payer: Self-pay | Admitting: Advanced Practice Midwife

## 2020-09-14 VITALS — BP 122/82 | Ht 60.0 in | Wt 147.0 lb

## 2020-09-14 DIAGNOSIS — Z3009 Encounter for other general counseling and advice on contraception: Secondary | ICD-10-CM | POA: Diagnosis not present

## 2020-09-14 MED ORDER — NORETHINDRONE ACET-ETHINYL EST 1.5-30 MG-MCG PO TABS: 1.0000 | ORAL_TABLET | Freq: Every day | ORAL | 3 refills | Status: DC

## 2020-09-14 NOTE — Progress Notes (Signed)
   Patient ID: Ann Hopkins, female   DOB: April 04, 2001, 20 y.o.   MRN: [id number]  Reason for Consult: Consult    Subjective:  HPI:  Ann Hopkins is a 20 y.o. female being seen for birth control counseling and for initial prescription of OCPs. She has tried Depo after the birth of her daughter in 2020 and it caused her to have excessive bleeding. She does not want LARCs due to stories she has heard of complications. She has used oral birth control as a teen with good success for regulation of cycles. She would like to try that again. She is a non smoker and generally healthy without contraindication for OCP.   Recommend PAP smear at next annual exam.  Past Medical History:  Diagnosis Date  . Medical history non-contributory   . Seasonal allergies    Family History  Problem Relation Age of Onset  . Cancer Other   . Cancer Maternal Grandfather   . Diabetes Maternal Grandfather    Past Surgical History:  Procedure Laterality Date  . childbirth    . NO PAST SURGERIES      Short Social History:  Social History   Tobacco Use  . Smoking status: Passive Smoke Exposure - Never Smoker  . Smokeless tobacco: Never Used  Substance Use Topics  . Alcohol use: No    Allergies  Allergen Reactions  . Latex Rash    Current Outpatient Medications  Medication Sig Dispense Refill  . Norethindrone Acetate-Ethinyl Estradiol (LOESTRIN) 1.5-30 MG-MCG tablet Take 1 tablet by mouth daily. 90 tablet 3   No current facility-administered medications for this visit.    Review of Systems  Constitutional: Negative for chills and fever.  HENT: Negative for congestion, ear discharge, ear pain, hearing loss, sinus pain and sore throat.   Eyes: Negative for blurred vision and double vision.  Respiratory: Negative for cough, shortness of breath and wheezing.   Cardiovascular: Negative for chest pain, palpitations and leg swelling.  Gastrointestinal: Negative for abdominal pain, blood in  stool, constipation, diarrhea, heartburn, melena, nausea and vomiting.  Genitourinary: Negative for dysuria, flank pain, frequency, hematuria and urgency.  Musculoskeletal: Negative for back pain, joint pain and myalgias.  Skin: Negative for itching and rash.  Neurological: Negative for dizziness, tingling, tremors, sensory change, speech change, focal weakness, seizures, loss of consciousness, weakness and headaches.  Endo/Heme/Allergies: Negative for environmental allergies. Does not bruise/bleed easily.  Psychiatric/Behavioral: Negative for depression, hallucinations, memory loss, substance abuse and suicidal ideas. The patient is not nervous/anxious and does not have insomnia.         Objective:  Objective   Vitals:   09/14/20 1508  BP: 122/82  Weight: 147 lb (66.7 kg)  Height: 5' (1.524 m)   Body mass index is 28.71 kg/m. Constitutional: Well nourished, well developed female in no acute distress.  HEENT: normal Skin: Warm and dry.  Cardiovascular: Regular rate and rhythm.   Extremity: no edema Respiratory: Clear to auscultation bilateral. Normal respiratory effort Neuro: DTRs 2+, Cranial nerves grossly intact Psych: Alert and Oriented x3. No memory deficits. Normal mood and affect.  MS: normal gait, normal bilateral lower extremity ROM/strength/stability.  Time spent in consultation and exam of patient: 18 minutes Assessment/Plan:     20 y.o. G1 P46 female birth control counseling and initiation of OCP  Rx Junel Follow up as needed Annual with PAP smear in 1 year   Tresea Mall CNM [facility] 09/14/2020, 4:13 PM

## 2020-09-18 ENCOUNTER — Other Ambulatory Visit: Payer: Self-pay

## 2020-09-18 ENCOUNTER — Emergency Department (HOSPITAL_COMMUNITY)
Admission: EM | Admit: 2020-09-18 | Discharge: 2020-09-18 | Disposition: A | Discharge status: Home / Self Care | Payer: Medicaid Other | Attending: Emergency Medicine | Admitting: Emergency Medicine

## 2020-09-18 DIAGNOSIS — Z7722 Contact with and (suspected) exposure to environmental tobacco smoke (acute) (chronic): Secondary | ICD-10-CM | POA: Insufficient documentation

## 2020-09-18 DIAGNOSIS — Z91040 Latex allergy status: Secondary | ICD-10-CM | POA: Diagnosis not present

## 2020-09-18 DIAGNOSIS — D72829 Elevated white blood cell count, unspecified: Secondary | ICD-10-CM | POA: Insufficient documentation

## 2020-09-18 DIAGNOSIS — R112 Nausea with vomiting, unspecified: Secondary | ICD-10-CM | POA: Insufficient documentation

## 2020-09-18 LAB — URINALYSIS, ROUTINE W REFLEX MICROSCOPIC
Bilirubin Urine: NEGATIVE
Glucose, UA: NEGATIVE mg/dL
Hgb urine dipstick: NEGATIVE
Ketones, ur: NEGATIVE mg/dL
Nitrite: NEGATIVE
Protein, ur: 30 mg/dL — AB
Specific Gravity, Urine: 1.029 (ref 1.005–1.030)
pH: 5 (ref 5.0–8.0)

## 2020-09-18 LAB — POC URINE PREG, ED: Preg Test, Ur: NEGATIVE

## 2020-09-18 LAB — PREGNANCY, URINE: Preg Test, Ur: NEGATIVE

## 2020-09-18 MED ORDER — ONDANSETRON 4 MG PO TBDP: 4.0000 mg | ORAL_TABLET | Freq: Three times a day (TID) | ORAL | 0 refills | Status: DC | PRN

## 2020-09-18 MED ORDER — ONDANSETRON 4 MG PO TBDP
4.0000 mg | ORAL_TABLET | Freq: Once | ORAL | Status: AC
  Administered 2020-09-18: 4 mg via ORAL
  Filled 2020-09-18: qty 1

## 2020-09-18 NOTE — ED Notes (Signed)
Pt states that she began birth control 4 days ago. Prior to starting the medication she was not given a pregnancy test. Pt did have a period on August 26, 2020 that lasted 3 days and was light. This is not normal per pt.

## 2020-09-18 NOTE — ED Provider Notes (Signed)
[facility] EMERGENCY DEPARTMENT Provider Note   CSN: [id number] Arrival date & time: 09/18/20  0300     History Chief Complaint  Patient presents with  . Nausea  . Emesis    Ann Hopkins is a 20 y.o. female.  Patient presents with nausea and vomiting that woke her from sleep about 2 AM.  States this is happened for the past 2 nights and again tonight.  She threw up 1 time in the previous 2 nights but today she is thrown up 4-5 times.  States this only occurs at night.  During the day she is fine able to eat and drink without a problem.  No fever.  No diarrhea.  No pain with urination or blood in the urine.  No abdominal pain. She became concerned tonight when she threw up 3-4 times which was more than the previous nights when she only 1 time. Her last menstrual cycle was March 26.  She did start birth control 4 days ago. She states she had an irregular period then.   The history is provided by the patient.       Past Medical History:  Diagnosis Date  . Medical history non-contributory   . Seasonal allergies     Patient Active Problem List   Diagnosis Date Noted  . Normal labor 07/10/2018  . Indication for care in labor and delivery, antepartum 07/03/2018  . Back pain affecting pregnancy in third trimester 06/29/2018  . Rh negative state in antepartum period 12/10/2017  . Asymptomatic bacteriuria during pregnancy in first trimester 12/08/2017    Past Surgical History:  Procedure Laterality Date  . childbirth    . NO PAST SURGERIES       OB History    Gravida  1   Para  1   Term  1   Preterm      AB      Living  1     SAB      IAB      Ectopic      Multiple  0   Live Births  1           Family History  Problem Relation Age of Onset  . Cancer Other   . Cancer Maternal Grandfather   . Diabetes Maternal Grandfather     Social History   Tobacco Use  . Smoking status: Passive Smoke Exposure - Never Smoker  . Smokeless tobacco:  Never Used  Vaping Use  . Vaping Use: Never used  Substance Use Topics  . Alcohol use: No  . Drug use: No    Home Medications Prior to Admission medications   Medication Sig Start Date End Date Taking? Authorizing Provider  Norethindrone Acetate-Ethinyl Estradiol (LOESTRIN) 1.5-30 MG-MCG tablet Take 1 tablet by mouth daily. 09/14/20   Tresea Mall, CNM    Allergies    Latex  Review of Systems   Review of Systems  Constitutional: Negative for activity change, appetite change and fever.  HENT: Negative for congestion and rhinorrhea.   Respiratory: Negative for chest tightness and shortness of breath.   Cardiovascular: Negative for chest pain.  Gastrointestinal: Positive for nausea and vomiting. Negative for abdominal pain.  Genitourinary: Negative for dysuria, hematuria and urgency.  Musculoskeletal: Negative for arthralgias and myalgias.  Skin: Negative for rash.  Neurological: Negative for dizziness, weakness and headaches.    all other systems are negative except as noted in the HPI and PMH.  Physical Exam Updated Vital Signs BP Marland Kitchen)  137/93 (BP Location: Left Arm)   Pulse 78   Temp 97.8 F (36.6 C) (Axillary)   Resp 18   Ht 5' (1.524 m)   Wt 66.7 kg   LMP 08/26/2020 Comment: only lasted 3 days and lighter than normal  SpO2 99%   BMI 28.71 kg/m   Physical Exam Vitals and nursing note reviewed.  Constitutional:      General: She is not in acute distress.    Appearance: She is well-developed.  HENT:     Head: Normocephalic and atraumatic.     Mouth/Throat:     Pharynx: No oropharyngeal exudate.  Eyes:     Conjunctiva/sclera: Conjunctivae normal.     Pupils: Pupils are equal, round, and reactive to light.  Neck:     Comments: No meningismus. Cardiovascular:     Rate and Rhythm: Normal rate and regular rhythm.     Heart sounds: Normal heart sounds. No murmur heard.   Pulmonary:     Effort: Pulmonary effort is normal. No respiratory distress.     Breath  sounds: Normal breath sounds.  Abdominal:     Palpations: Abdomen is soft.     Tenderness: There is no abdominal tenderness. There is no guarding or rebound.  Musculoskeletal:        General: No tenderness. Normal range of motion.     Cervical back: Normal range of motion and neck supple.  Skin:    General: Skin is warm.  Neurological:     Mental Status: She is alert and oriented to person, place, and time.     Cranial Nerves: No cranial nerve deficit.     Motor: No abnormal muscle tone.     Coordination: Coordination normal.     Comments:  5/5 strength throughout. CN 2-12 intact.Equal grip strength.   Psychiatric:        Behavior: Behavior normal.     ED Results / Procedures / Treatments   Labs (all labs ordered are listed, but only abnormal results are displayed) Labs Reviewed  URINALYSIS, ROUTINE W REFLEX MICROSCOPIC - Abnormal; Notable for the following components:      Result Value   APPearance HAZY (*)    Protein, ur 30 (*)    Leukocytes,Ua SMALL (*)    Bacteria, UA RARE (*)    All other components within normal limits  URINE CULTURE  PREGNANCY, URINE  POC URINE PREG, ED    EKG None  Radiology No results found.  Procedures Procedures   Medications Ordered in ED Medications - No data to display  ED Course  I have reviewed the triage vital signs and the nursing notes.  Pertinent labs & imaging results that were available during my care of the patient were reviewed by me and considered in my medical decision making (see chart for details).    MDM Rules/Calculators/A&P                         Intermittent vomiting for the past 3 nights.  No symptoms during the day with good p.o. intake.  Abdomen soft and nontender.  Rule out pregnancy  HCG is negative.  UA with small leukocytes and rare bacteria.  Will send for culture.  She denies dysuria.  Patient tolerating p.o. well after Zofran.  Discussed potential etiologies of her vomiting which could be acid  reflux disease or due to the birth control itself.  Her abdomen is soft and nontender and low suspicion for surgical pathology.  Offered patient further evaluation with labs and potential imaging but she declines.  She feels better after Zofran is tolerating p.o. Low suspicion for surgical pathology.  Advised to call her gynecologist for discussion of the birth control pill as this is the only thing new to her regimen which could be causing her vomiting.  Advise she can return to the ED at any point time for further evaluation of her nausea and vomiting if she develops abdominal pain or any other concerns. Final Clinical Impression(s) / ED Diagnoses Final diagnoses:  Non-intractable vomiting with nausea, unspecified vomiting type    Rx / DC Orders ED Discharge Orders    None       Isais Klipfel, Jeannett Senior, MD 09/18/20 [phone]

## 2020-09-18 NOTE — Discharge Instructions (Signed)
Your pregnancy test is negative.  Your urine will be cultured and you will be called if you need an antibiotic.  You declined further evaluation of your vomiting today with lab work and possible imaging.  Take the nausea medication as prescribed and follow-up with your doctor.  Discuss stopping the birth control as this may be causing your vomiting.  Return to the ED if you wish to be reevaluated for your vomiting or any other concerns.

## 2020-09-18 NOTE — ED Triage Notes (Signed)
Complaints of nausea and vomiting that began at 2 am for the last 3 nights.

## 2020-09-20 LAB — URINE CULTURE

## 2020-11-20 ENCOUNTER — Telehealth: Payer: Self-pay

## 2020-11-20 NOTE — Telephone Encounter (Signed)
Pt calling; has UTI; also positive c covid; can antibx be called in?  [phone] VM not set up yet.  Calling for sxs.

## 2020-11-22 NOTE — Telephone Encounter (Signed)
2nd attempt vm not set up

## 2020-11-22 NOTE — Telephone Encounter (Signed)
Sxs are burning and frequency.

## 2020-11-25 ENCOUNTER — Other Ambulatory Visit: Payer: Self-pay | Admitting: Advanced Practice Midwife

## 2020-11-25 DIAGNOSIS — R3915 Urgency of urination: Secondary | ICD-10-CM

## 2020-11-25 DIAGNOSIS — R300 Dysuria: Secondary | ICD-10-CM

## 2020-11-25 DIAGNOSIS — R350 Frequency of micturition: Secondary | ICD-10-CM

## 2020-11-25 MED ORDER — CEPHALEXIN 500 MG PO CAPS
500.0000 mg | ORAL_CAPSULE | Freq: Three times a day (TID) | ORAL | 0 refills | Status: AC
Start: 2020-11-25 — End: 2020-12-02

## 2020-11-25 NOTE — Progress Notes (Signed)
Spoke with patient- symptoms are burning, frequency, urgency. Will send Rx and if symptoms persist she will bring a specimen. Currently on Covid isolation.

## 2021-01-18 ENCOUNTER — Other Ambulatory Visit: Payer: Self-pay

## 2021-01-18 ENCOUNTER — Emergency Department (HOSPITAL_COMMUNITY)
Admission: EM | Admit: 2021-01-18 | Discharge: 2021-01-18 | Disposition: A | Discharge status: Home / Self Care | Payer: Medicaid Other | Attending: Emergency Medicine | Admitting: Emergency Medicine

## 2021-01-18 DIAGNOSIS — M545 Low back pain, unspecified: Secondary | ICD-10-CM | POA: Diagnosis not present

## 2021-01-18 DIAGNOSIS — Z7722 Contact with and (suspected) exposure to environmental tobacco smoke (acute) (chronic): Secondary | ICD-10-CM | POA: Insufficient documentation

## 2021-01-18 DIAGNOSIS — Z91040 Latex allergy status: Secondary | ICD-10-CM | POA: Diagnosis not present

## 2021-01-18 LAB — POC URINE PREG, ED: Preg Test, Ur: NEGATIVE

## 2021-01-18 MED ORDER — KETOROLAC TROMETHAMINE 30 MG/ML IJ SOLN
30.0000 mg | Freq: Once | INTRAMUSCULAR | Status: AC
  Administered 2021-01-18: 30 mg via INTRAMUSCULAR
  Filled 2021-01-18: qty 1

## 2021-01-18 MED ORDER — NAPROXEN 500 MG PO TABS: 500.0000 mg | ORAL_TABLET | Freq: Two times a day (BID) | ORAL | 0 refills | Status: AC

## 2021-01-18 MED ORDER — TIZANIDINE HCL 4 MG PO TABS: 2.0000 mg | ORAL_TABLET | Freq: Four times a day (QID) | ORAL | 0 refills | Status: DC | PRN

## 2021-01-18 MED ORDER — DEXAMETHASONE 4 MG PO TABS
6.0000 mg | ORAL_TABLET | Freq: Once | ORAL | Status: AC
  Administered 2021-01-18: 6 mg via ORAL
  Filled 2021-01-18: qty 2

## 2021-01-18 MED ORDER — OXYCODONE-ACETAMINOPHEN 5-325 MG PO TABS: 1.0000 | ORAL_TABLET | Freq: Four times a day (QID) | ORAL | 0 refills | Status: DC | PRN

## 2021-01-18 NOTE — Discharge Instructions (Addendum)
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  Take the medications as written.  Return to the ER if you have worsening symptoms new numbness weakness or any additional concerns.

## 2021-01-18 NOTE — ED Provider Notes (Signed)
[facility] EMERGENCY DEPARTMENT Provider Note   CSN: [id number] Arrival date & time: 01/18/21  1941     History No chief complaint on file.   Ann Hopkins is a 20 y.o. female.  Patient presents chief complaint of right-sided lower back pain rating down the right leg.  She states symptoms started last night.  She is working at her job trying to lift up a refrigerator, when she had sudden onset of this pain.  Denies fall.  Denies fevers or cough or vomiting or diarrhea.  She states that she had to finish her shift but was in significant pain.  Denies any new numbness or weakness.  Pain is worse when she moves a certain way.      Past Medical History:  Diagnosis Date   Medical history non-contributory    Seasonal allergies     Patient Active Problem List   Diagnosis Date Noted   Normal labor 07/10/2018   Indication for care in labor and delivery, antepartum 07/03/2018   Back pain affecting pregnancy in third trimester 06/29/2018   Rh negative state in antepartum period 12/10/2017   Asymptomatic bacteriuria during pregnancy in first trimester 12/08/2017    Past Surgical History:  Procedure Laterality Date   childbirth     NO PAST SURGERIES       OB History     Gravida  1   Para  1   Term  1   Preterm      AB      Living  1      SAB      IAB      Ectopic      Multiple  0   Live Births  1           Family History  Problem Relation Age of Onset   Cancer Other    Cancer Maternal Grandfather    Diabetes Maternal Grandfather     Social History   Tobacco Use   Smoking status: Passive Smoke Exposure - Never Smoker   Smokeless tobacco: Never  Vaping Use   Vaping Use: Never used  Substance Use Topics   Alcohol use: No   Drug use: No    Home Medications Prior to Admission medications   Medication Sig Start Date End Date Taking? Authorizing Provider  naproxen (NAPROSYN) 500 MG tablet Take 1 tablet (500 mg total) by mouth 2 (two) times  daily with a meal for 15 doses. 01/18/21 01/26/21 Yes Cheryll Cockayne, MD  oxyCODONE-acetaminophen (PERCOCET/ROXICET) 5-325 MG tablet Take 1 tablet by mouth every 6 (six) hours as needed for up to 8 doses for severe pain. 01/18/21  Yes Shalaunda Weatherholtz, Eustace Moore, MD  tiZANidine (ZANAFLEX) 4 MG tablet Take 0.5 tablets (2 mg total) by mouth every 6 (six) hours as needed for up to 30 doses for muscle spasms. 01/18/21  Yes Cheryll Cockayne, MD  Norethindrone Acetate-Ethinyl Estradiol (LOESTRIN) 1.5-30 MG-MCG tablet Take 1 tablet by mouth daily. 09/14/20   Tresea Mall, CNM  ondansetron (ZOFRAN ODT) 4 MG disintegrating tablet Take 1 tablet (4 mg total) by mouth every 8 (eight) hours as needed for nausea or vomiting. 09/18/20   Glynn Octave, MD    Allergies    Latex  Review of Systems   Review of Systems  Constitutional:  Negative for fever.  HENT:  Negative for ear pain.   Eyes:  Negative for pain.  Respiratory:  Negative for cough.   Cardiovascular:  Negative for chest  pain.  Gastrointestinal:  Negative for abdominal pain.  Genitourinary:  Negative for flank pain.  Musculoskeletal:  Positive for back pain.  Skin:  Negative for rash.  Neurological:  Negative for headaches.   Physical Exam Updated Vital Signs BP 118/82   Pulse 94   Temp 98.1 F (36.7 C) (Oral)   Resp 18   Ht 5\' 3"  (1.6 m)   Wt 64.9 kg   LMP 01/18/2021   SpO2 98%   BMI 25.33 kg/m   Physical Exam Constitutional:      General: She is not in acute distress.    Appearance: Normal appearance.  HENT:     Head: Normocephalic.     Nose: Nose normal.  Eyes:     Extraocular Movements: Extraocular movements intact.  Cardiovascular:     Rate and Rhythm: Normal rate.  Pulmonary:     Effort: Pulmonary effort is normal.  Musculoskeletal:        General: Normal range of motion.     Cervical back: Normal range of motion.     Comments: No C or T-spine midline tenderness.  L-spine 2345 midline and right paraspinal tenderness noted.   Neurological:     General: No focal deficit present.     Mental Status: She is alert. Mental status is at baseline.     Comments: No saddle anesthesia.  Negative straight leg test bilateral lower extremities.  Moderately antalgic gait.    ED Results / Procedures / Treatments   Labs (all labs ordered are listed, but only abnormal results are displayed) Labs Reviewed  POC URINE PREG, ED    EKG None  Radiology No results found.  Procedures Procedures   Medications Ordered in ED Medications  dexamethasone (DECADRON) tablet 6 mg (6 mg Oral Given 01/18/21 0827)  ketorolac (TORADOL) 30 MG/ML injection 30 mg (30 mg Intramuscular Given 01/18/21 01/20/21)    ED Course  I have reviewed the triage vital signs and the nursing notes.  Pertinent labs & imaging results that were available during my care of the patient were reviewed by me and considered in my medical decision making (see chart for details).    MDM Rules/Calculators/A&P                           Pregnancy test is negative.  Patient given Toradol and Tylenol with improvement of symptoms.  Recommending rest at home and follow-up with primary care doctor within the week.  Recommending immediate return for worsening pain fevers numbness weakness or any additional concerns.  Final Clinical Impression(s) / ED Diagnoses Final diagnoses:  Acute right-sided low back pain without sciatica    Rx / DC Orders ED Discharge Orders          Ordered    oxyCODONE-acetaminophen (PERCOCET/ROXICET) 5-325 MG tablet  Every 6 hours PRN        01/18/21 0931    naproxen (NAPROSYN) 500 MG tablet  2 times daily with meals        01/18/21 0931    tiZANidine (ZANAFLEX) 4 MG tablet  Every 6 hours PRN        01/18/21 0931             01/20/21, MD 01/18/21 [phone]

## 2021-01-18 NOTE — ED Triage Notes (Signed)
Pt hurt her back last night moving a refrigerator at work. Lower back pain, pain  level 9/10

## 2021-01-22 ENCOUNTER — Encounter: Payer: Self-pay | Admitting: *Deleted

## 2021-01-22 ENCOUNTER — Other Ambulatory Visit: Payer: Self-pay

## 2021-01-22 ENCOUNTER — Ambulatory Visit
Admission: EM | Admit: 2021-01-22 | Discharge: 2021-01-22 | Disposition: A | Discharge status: Home / Self Care | Payer: Medicaid Other | Attending: Family Medicine | Admitting: Family Medicine

## 2021-01-22 DIAGNOSIS — M5442 Lumbago with sciatica, left side: Secondary | ICD-10-CM

## 2021-01-22 DIAGNOSIS — Z0289 Encounter for other administrative examinations: Secondary | ICD-10-CM | POA: Diagnosis not present

## 2021-01-22 DIAGNOSIS — M5441 Lumbago with sciatica, right side: Secondary | ICD-10-CM | POA: Diagnosis not present

## 2021-01-22 MED ORDER — PREDNISONE 50 MG PO TABS: 50.0000 mg | ORAL_TABLET | Freq: Every day | ORAL | 0 refills | Status: AC

## 2021-01-22 NOTE — ED Triage Notes (Signed)
Pt reports back pain with foot /leg numbness . Today rt leg and foot are throbbing today.

## 2021-01-22 NOTE — ED Provider Notes (Signed)
[facility]    CSN: [id number] Arrival date & time: 01/22/21  1003      History   Chief Complaint Chief Complaint  Patient presents with   Back Pain    HPI Ann Hopkins is a 20 y.o. female.   HPI Patient presents today in need of a work excuse. Patient seen at the ER on 01/18/21 for back pain and is continuing to experience pain limiting her ability to work. Back pain is related to work injury per patient and she is scheduled to see an orthopedic specialist in a few days. Requesting another option for pain management as she is unable to tolerate pain medication prescribed at the ER  Past Medical History:  Diagnosis Date   Medical history non-contributory    Seasonal allergies     Patient Active Problem List   Diagnosis Date Noted   Normal labor 07/10/2018   Indication for care in labor and delivery, antepartum 07/03/2018   Back pain affecting pregnancy in third trimester 06/29/2018   Rh negative state in antepartum period 12/10/2017   Asymptomatic bacteriuria during pregnancy in first trimester 12/08/2017    Past Surgical History:  Procedure Laterality Date   childbirth     NO PAST SURGERIES      OB History     Gravida  1   Para  1   Term  1   Preterm      AB      Living  1      SAB      IAB      Ectopic      Multiple  0   Live Births  1            Home Medications    Prior to Admission medications   Medication Sig Start Date End Date Taking? Authorizing Provider  predniSONE (DELTASONE) 50 MG tablet Take 1 tablet (50 mg total) by mouth daily with breakfast for 5 days. 01/22/21 01/27/21 Yes Bing Neighbors, FNP  naproxen (NAPROSYN) 500 MG tablet Take 1 tablet (500 mg total) by mouth 2 (two) times daily with a meal for 15 doses. 01/18/21 01/26/21  Cheryll Cockayne, MD  Norethindrone Acetate-Ethinyl Estradiol (LOESTRIN) 1.5-30 MG-MCG tablet Take 1 tablet by mouth daily. 09/14/20   Tresea Mall, CNM  ondansetron (ZOFRAN ODT)  4 MG disintegrating tablet Take 1 tablet (4 mg total) by mouth every 8 (eight) hours as needed for nausea or vomiting. 09/18/20   Rancour, Jeannett Senior, MD  oxyCODONE-acetaminophen (PERCOCET/ROXICET) 5-325 MG tablet Take 1 tablet by mouth every 6 (six) hours as needed for up to 8 doses for severe pain. 01/18/21   Cheryll Cockayne, MD  tiZANidine (ZANAFLEX) 4 MG tablet Take 0.5 tablets (2 mg total) by mouth every 6 (six) hours as needed for up to 30 doses for muscle spasms. 01/18/21   Cheryll Cockayne, MD    Family History Family History  Problem Relation Age of Onset   Cancer Other    Cancer Maternal Grandfather    Diabetes Maternal Grandfather     Social History Social History   Tobacco Use   Smoking status: Passive Smoke Exposure - Never Smoker   Smokeless tobacco: Never  Vaping Use   Vaping Use: Never used  Substance Use Topics   Alcohol use: No   Drug use: No     Allergies   Latex   Review of Systems Review of Systems Pertinent negatives listed in HPI   Physical  Exam Triage Vital Signs ED Triage Vitals  Enc Vitals Group     BP 01/22/21 1037 120/74     Pulse Rate 01/22/21 1037 73     Resp 01/22/21 1037 16     Temp 01/22/21 1037 98.3 F (36.8 C)     Temp src --      SpO2 01/22/21 1037 98 %     Weight --      Height --      Head Circumference --      Peak Flow --      Pain Score 01/22/21 1038 7     Pain Loc --      Pain Edu? --      Excl. in GC? --    No data found.  Updated Vital Signs BP 120/74   Pulse 73   Temp 98.3 F (36.8 C)   Resp 16   LMP 01/18/2021   SpO2 98%   Visual Acuity Right Eye Distance:   Left Eye Distance:   Bilateral Distance:    Right Eye Near:   Left Eye Near:    Bilateral Near:     Physical Exam Cardiovascular:     Rate and Rhythm: Normal rate.  Pulmonary:     Effort: Pulmonary effort is normal.     Breath sounds: Normal breath sounds.  Musculoskeletal:     Cervical back: Normal and normal range of motion.     Thoracic  back: Tenderness present. No bony tenderness. Normal range of motion.     Lumbar back: Normal. Normal range of motion.  Neurological:     Mental Status: She is alert. She is disoriented.  Psychiatric:        Attention and Perception: Attention normal.        Mood and Affect: Mood normal.        Speech: Speech normal.     UC Treatments / Results  Labs (all labs ordered are listed, but only abnormal results are displayed) Labs Reviewed - No data to display  EKG   Radiology No results found.  Procedures Procedures (including critical care time)  Medications Ordered in UC Medications - No data to display  Initial Impression / Assessment and Plan / UC Course  I have reviewed the triage vital signs and the nursing notes.  Pertinent labs & imaging results that were available during my care of the patient were reviewed by me and considered in my medical decision making (see chart for details).    Work excuse provided date to cover patient until seen by specialist in 3 days. Prednisone 50 mg daily x 5 days for management of acute inflammation causing back pain. ER precautions given. RTC/PRN Final Clinical Impressions(s) / UC Diagnoses   Final diagnoses:  Encounter to obtain excuse from work  Acute bilateral low back pain with bilateral sciatica   Discharge Instructions   None    ED Prescriptions     Medication Sig Dispense Auth. Provider   predniSONE (DELTASONE) 50 MG tablet Take 1 tablet (50 mg total) by mouth daily with breakfast for 5 days. 5 tablet Bing Neighbors, FNP      PDMP not reviewed this encounter.   Bing Neighbors, [place] 01/26/21 [phone]

## 2021-03-08 ENCOUNTER — Other Ambulatory Visit: Payer: Self-pay

## 2021-03-08 DIAGNOSIS — Z3009 Encounter for other general counseling and advice on contraception: Secondary | ICD-10-CM

## 2021-03-08 MED ORDER — NORETHINDRONE ACET-ETHINYL EST 1.5-30 MG-MCG PO TABS: 1.0000 | ORAL_TABLET | Freq: Every day | ORAL | 3 refills | Status: DC

## 2021-06-26 ENCOUNTER — Ambulatory Visit
Admission: RE | Admit: 2021-06-26 | Discharge: 2021-06-26 | Disposition: A | Discharge status: Home / Self Care | Payer: Medicaid Other | Source: Ambulatory Visit | Attending: Family Medicine | Admitting: Family Medicine

## 2021-06-26 ENCOUNTER — Other Ambulatory Visit: Payer: Self-pay

## 2021-06-26 VITALS — BP 127/62 | HR 80 | Temp 98.3°F | Resp 16

## 2021-06-26 DIAGNOSIS — R112 Nausea with vomiting, unspecified: Secondary | ICD-10-CM | POA: Diagnosis not present

## 2021-06-26 DIAGNOSIS — R509 Fever, unspecified: Secondary | ICD-10-CM

## 2021-06-26 DIAGNOSIS — R197 Diarrhea, unspecified: Secondary | ICD-10-CM

## 2021-06-26 DIAGNOSIS — Z20828 Contact with and (suspected) exposure to other viral communicable diseases: Secondary | ICD-10-CM | POA: Diagnosis not present

## 2021-06-26 DIAGNOSIS — R52 Pain, unspecified: Secondary | ICD-10-CM

## 2021-06-26 LAB — POCT URINE PREGNANCY: Preg Test, Ur: NEGATIVE

## 2021-06-26 MED ORDER — ONDANSETRON 4 MG PO TBDP: 4.0000 mg | ORAL_TABLET | Freq: Three times a day (TID) | ORAL | 0 refills | Status: DC | PRN

## 2021-06-26 MED ORDER — LOPERAMIDE HCL 2 MG PO CAPS: 2.0000 mg | ORAL_CAPSULE | Freq: Four times a day (QID) | ORAL | 0 refills | Status: DC | PRN

## 2021-06-26 NOTE — ED Triage Notes (Signed)
Patient states that last night she kept on vomiting  Patient states she had a fever this morning of 100.6 so she took tylenol and Ibuprofen  Patient states that her body is achy  Patient thinks she got sick from her dad last week  Patient has not had a menstrual cycle since November

## 2021-06-26 NOTE — ED Provider Notes (Signed)
[facility]    CSN: [id number] Arrival date & time: 06/26/21  1439      History   Chief Complaint Chief Complaint  Patient presents with   Fever    Body aches, fever, headache and vomiting    HPI Ann Hopkins is a 21 y.o. female.   Patient presenting today with 1 day history of fever, chills, body aches, nausea, vomiting, diarrhea.  Denies cough, congestion, sore throat, chest pain, shortness of breath, dizziness.  Trying ibuprofen, Tylenol with mild temporary relief of fever and body aches.  Not tolerating p.o. today.  Patient states her dad had the flu last week, otherwise no known sick contacts recently.   Past Medical History:  Diagnosis Date   Medical history non-contributory    Seasonal allergies     Patient Active Problem List   Diagnosis Date Noted   Normal labor 07/10/2018   Indication for care in labor and delivery, antepartum 07/03/2018   Back pain affecting pregnancy in third trimester 06/29/2018   Rh negative state in antepartum period 12/10/2017   Asymptomatic bacteriuria during pregnancy in first trimester 12/08/2017    Past Surgical History:  Procedure Laterality Date   childbirth     NO PAST SURGERIES      OB History     Gravida  1   Para  1   Term  1   Preterm      AB      Living  1      SAB      IAB      Ectopic      Multiple  0   Live Births  1            Home Medications    Prior to Admission medications   Medication Sig Start Date End Date Taking? Authorizing Provider  loperamide (IMODIUM) 2 MG capsule Take 1 capsule (2 mg total) by mouth 4 (four) times daily as needed for diarrhea or loose stools. 06/26/21  Yes Particia NearingLane, Jayni Prescher Elizabeth, PA-C  Norethindrone Acetate-Ethinyl Estradiol (LOESTRIN) 1.5-30 MG-MCG tablet Take 1 tablet by mouth daily. 03/08/21   Tresea MallGledhill, Jane, CNM  ondansetron (ZOFRAN ODT) 4 MG disintegrating tablet Take 1 tablet (4 mg total) by mouth every 8 (eight) hours as needed for  nausea or vomiting. 06/26/21   Particia NearingLane, Takya Vandivier Elizabeth, PA-C  oxyCODONE-acetaminophen (PERCOCET/ROXICET) 5-325 MG tablet Take 1 tablet by mouth every 6 (six) hours as needed for up to 8 doses for severe pain. 01/18/21   Cheryll CockayneHong, Joshua S, MD  tiZANidine (ZANAFLEX) 4 MG tablet Take 0.5 tablets (2 mg total) by mouth every 6 (six) hours as needed for up to 30 doses for muscle spasms. 01/18/21   Cheryll CockayneHong, Joshua S, MD    Family History Family History  Problem Relation Age of Onset   Cancer Other    Cancer Maternal Grandfather    Diabetes Maternal Grandfather     Social History Social History   Tobacco Use   Smoking status: Never    Passive exposure: Never   Smokeless tobacco: Never  Vaping Use   Vaping Use: Never used  Substance Use Topics   Alcohol use: No   Drug use: No     Allergies   Latex   Review of Systems Review of Systems Per HPI  Physical Exam Triage Vital Signs ED Triage Vitals  Enc Vitals Group     BP 06/26/21 1458 127/62     Pulse Rate 06/26/21 1458 80  Resp 06/26/21 1458 16     Temp 06/26/21 1458 98.3 F (36.8 C)     Temp Source 06/26/21 1458 Oral     SpO2 06/26/21 1458 97 %     Weight --      Height --      Head Circumference --      Peak Flow --      Pain Score 06/26/21 1455 7     Pain Loc --      Pain Edu? --      Excl. in GC? --    No data found.  Updated Vital Signs BP 127/62 (BP Location: Right Arm)    Pulse 80    Temp 98.3 F (36.8 C) (Oral)    Resp 16    LMP 04/11/2021 (Approximate)    SpO2 97%   Visual Acuity Right Eye Distance:   Left Eye Distance:   Bilateral Distance:    Right Eye Near:   Left Eye Near:    Bilateral Near:     Physical Exam Vitals and nursing note reviewed.  Constitutional:      Appearance: Normal appearance. She is not ill-appearing.  HENT:     Head: Atraumatic.     Nose: Nose normal.     Mouth/Throat:     Mouth: Mucous membranes are moist.     Pharynx: Oropharynx is clear.  Eyes:     Extraocular  Movements: Extraocular movements intact.     Conjunctiva/sclera: Conjunctivae normal.  Cardiovascular:     Rate and Rhythm: Normal rate and regular rhythm.     Heart sounds: Normal heart sounds.  Pulmonary:     Effort: Pulmonary effort is normal.     Breath sounds: Normal breath sounds.  Abdominal:     General: Bowel sounds are normal. There is no distension.     Palpations: Abdomen is soft.     Tenderness: There is abdominal tenderness. There is no right CVA tenderness, left CVA tenderness or guarding.     Comments: Mild epigastric tenderness to palpation without guarding or distention  Musculoskeletal:        General: Normal range of motion.     Cervical back: Normal range of motion and neck supple.  Skin:    General: Skin is warm and dry.  Neurological:     Mental Status: She is alert and oriented to person, place, and time.  Psychiatric:        Mood and Affect: Mood normal.        Thought Content: Thought content normal.        Judgment: Judgment normal.     UC Treatments / Results  Labs (all labs ordered are listed, but only abnormal results are displayed) Labs Reviewed  COVID-19, FLU A+B NAA  POCT URINE PREGNANCY    EKG   Radiology No results found.  Procedures Procedures (including critical care time)  Medications Ordered in UC Medications - No data to display  Initial Impression / Assessment and Plan / UC Course  I have reviewed the triage vital signs and the nursing notes.  Pertinent labs & imaging results that were available during my care of the patient were reviewed by me and considered in my medical decision making (see chart for details).     Vital signs benign and reassuring, urine pregnancy performed given her missed menstrual cycle.  She states she is compliant with her oral contraceptive and urine pregnancy was negative today.  COVID and flu testing pending, suspect GI  virus causing symptoms.  Treat supportively with Zofran, Imodium, brat diet,  fluids.  Work note given.  Return for acutely worsening symptoms   Final Clinical Impressions(s) / UC Diagnoses   Final diagnoses:  Exposure to the flu  Nausea vomiting and diarrhea  Fever, unspecified  Generalized body aches   Discharge Instructions   None    ED Prescriptions     Medication Sig Dispense Auth. Provider   ondansetron (ZOFRAN ODT) 4 MG disintegrating tablet Take 1 tablet (4 mg total) by mouth every 8 (eight) hours as needed for nausea or vomiting. 20 tablet Particia Nearing, [place]   loperamide (IMODIUM) 2 MG capsule Take 1 capsule (2 mg total) by mouth 4 (four) times daily as needed for diarrhea or loose stools. 12 capsule Particia Nearing, [place]      PDMP not reviewed this encounter.   Particia Nearing, [place] 06/26/21 1544

## 2021-06-27 LAB — COVID-19, FLU A+B NAA
Influenza A, NAA: NOT DETECTED
Influenza B, NAA: NOT DETECTED
SARS-CoV-2, NAA: NOT DETECTED

## 2021-08-02 ENCOUNTER — Encounter (HOSPITAL_COMMUNITY): Payer: Self-pay | Admitting: *Deleted

## 2021-08-02 ENCOUNTER — Emergency Department (HOSPITAL_COMMUNITY)
Admission: EM | Admit: 2021-08-02 | Discharge: 2021-08-02 | Disposition: A | Discharge status: Home / Self Care | Payer: Medicaid Other | Attending: Emergency Medicine | Admitting: Emergency Medicine

## 2021-08-02 ENCOUNTER — Other Ambulatory Visit: Payer: Self-pay

## 2021-08-02 ENCOUNTER — Emergency Department (HOSPITAL_COMMUNITY): Payer: Medicaid Other

## 2021-08-02 DIAGNOSIS — Z79899 Other long term (current) drug therapy: Secondary | ICD-10-CM | POA: Insufficient documentation

## 2021-08-02 DIAGNOSIS — K5900 Constipation, unspecified: Secondary | ICD-10-CM | POA: Insufficient documentation

## 2021-08-02 DIAGNOSIS — M545 Low back pain, unspecified: Secondary | ICD-10-CM | POA: Insufficient documentation

## 2021-08-02 MED ORDER — PROMETHAZINE HCL 25 MG RE SUPP: 25.0000 mg | Freq: Four times a day (QID) | RECTAL | 0 refills | Status: DC | PRN

## 2021-08-02 MED ORDER — PROMETHAZINE HCL 25 MG PO TABS: 25.0000 mg | ORAL_TABLET | Freq: Four times a day (QID) | ORAL | 0 refills | Status: DC | PRN

## 2021-08-02 MED ORDER — GOLYTELY 236 G PO SOLR: 4000.0000 mL | Freq: Once | ORAL | 0 refills | Status: AC

## 2021-08-02 MED ORDER — ONDANSETRON HCL 4 MG PO TABS: 4.0000 mg | ORAL_TABLET | Freq: Four times a day (QID) | ORAL | 0 refills | Status: DC

## 2021-08-02 NOTE — ED Provider Notes (Signed)
?[facility] ?Provider Note ? ? ?CSN: [id number] ?Arrival date & time: 08/02/21  1740 ? ?  ? ?History ? ?Chief Complaint  ?Patient presents with  ? Constipation  ? ? ?Ann Hopkins is a 21 y.o. female. ? ? ?Constipation ? ?This patient is a pleasant 21 year old female, she denies chronic medical problems, that being said review of the medical record shows that she is in the emergency department or the urgent care or the office every other month or so for different complaints.  She reports that over the last 2 weeks she has not had a bowel movement which leaves her with some vague right-sided abdominal discomfort and lower back discomfort which seems to come in waves.  No fevers or chills no nausea or vomiting, no blood in the stools and she has had absolutely no stool output in 2 weeks.  She did have a prior pregnancy at the age of 21, she had a vaginal delivery, there is never been any abdominal surgery.  She has never had a bowel obstruction.  She went to the urgent care yesterday after she took a couple of laxatives without relief and they told her to come to the emergency department today if she was no better because it might be an "blockage".  Again the patient has no nausea or vomiting or abdominal distention.  She took 3 doses of MiraLAX yesterday without any relief or production of stool ? ?Home Medications ?Prior to Admission medications   ?Medication Sig Start Date End Date Taking? Authorizing Provider  ?Norethindrone Acetate-Ethinyl Estradiol (LOESTRIN) 1.5-30 MG-MCG tablet Take 1 tablet by mouth daily. 03/08/21  Yes Tresea Mall, CNM  ?ondansetron (ZOFRAN) 4 MG tablet Take 1 tablet (4 mg total) by mouth every 6 (six) hours. 08/02/21  Yes Eber Hong, MD  ?polyethylene glycol (GOLYTELY) 236 g solution Take 4,000 mLs by mouth once for 1 dose. 08/02/21 08/02/21 Yes Eber Hong, MD  ?polyethylene glycol (MIRALAX) 17 g packet Take 17 g by mouth daily as needed for mild constipation.   Yes  [provider]  ?   ? ?Allergies    ?Latex   ? ?Review of Systems   ?Review of Systems  ?Gastrointestinal:  Positive for constipation.  ?All other systems reviewed and are negative. ? ?Physical Exam ?Updated Vital Signs ?BP 115/71 (BP Location: Left Arm)   Pulse 66   Temp 98.4 ?F (36.9 ?C) (Oral)   Resp 16   Ht 1.524 m (5')   Wt 64.9 kg   LMP 05/04/2021 Comment: pt states type of BC causes her to not have a menstrual cycle  SpO2 99%   BMI 27.93 kg/m?  ?Physical Exam ?Vitals and nursing note reviewed.  ?Constitutional:   ?   General: She is not in acute distress. ?   Appearance: She is well-developed.  ?HENT:  ?   Head: Normocephalic and atraumatic.  ?   Mouth/Throat:  ?   Pharynx: No oropharyngeal exudate.  ?Eyes:  ?   General: No scleral icterus.    ?   Right eye: No discharge.     ?   Left eye: No discharge.  ?   Conjunctiva/sclera: Conjunctivae normal.  ?   Pupils: Pupils are equal, round, and reactive to light.  ?Neck:  ?   Thyroid: No thyromegaly.  ?   Vascular: No JVD.  ?Cardiovascular:  ?   Rate and Rhythm: Normal rate and regular rhythm.  ?   Heart sounds: Normal heart sounds. No  murmur heard. ?  No friction rub. No gallop.  ?Pulmonary:  ?   Effort: Pulmonary effort is normal. No respiratory distress.  ?   Breath sounds: Normal breath sounds. No wheezing or rales.  ?Abdominal:  ?   General: Bowel sounds are normal. There is no distension.  ?   Palpations: Abdomen is soft. There is no mass.  ?   Tenderness: There is no abdominal tenderness.  ?   Comments: There is no abdominal tenderness, no fullness, no masses palpated, no CVA tenderness  ?Musculoskeletal:     ?   General: No tenderness. Normal range of motion.  ?   Cervical back: Normal range of motion and neck supple.  ?Lymphadenopathy:  ?   Cervical: No cervical adenopathy.  ?Skin: ?   General: Skin is warm and dry.  ?   Findings: No erythema or rash.  ?Neurological:  ?   Mental Status: She is alert.  ?   Coordination: Coordination  normal.  ?Psychiatric:     ?   Behavior: Behavior normal.  ? ? ?ED Results / Procedures / Treatments   ?Labs ?(all labs ordered are listed, but only abnormal results are displayed) ?Labs Reviewed - No data to display ? ?EKG ?None ? ?Radiology ?DG Abdomen Acute W/Chest ? ?Result Date: 08/02/2021 ?CLINICAL DATA:  constipation EXAM: DG ABDOMEN ACUTE WITH 1 VIEW CHEST COMPARISON:  May 22, 2014 FINDINGS: The cardiomediastinal silhouette is normal in contour. No pleural effusion. No pneumothorax. No acute pleuroparenchymal abnormality. Air and stool-filled nondilated loops of bowel. Moderate colonic stool burden diffusely throughout the colon. No acute osseous abnormality noted. IMPRESSION: Nonobstructive bowel gas pattern with moderate colonic stool burden. No acute cardiopulmonary disease. Electronically Signed   By: Meda Klinefelter M.D.   On: 08/02/2021 08:22   ? ?Procedures ?Procedures  ? ? ?Medications Ordered in ED ?Medications - No data to display ? ?ED Course/ Medical Decision Making/ A&P ?  ?                        ?Medical Decision Making ?Amount and/or Complexity of Data Reviewed ?Radiology: ordered. ? ?Risk ?Prescription drug management. ? ? ?External records reviewed including urgent care visit from yesterday, multiple prior family doctor visits, the patient has some ongoing constipation, she has not had any significant changes in her diet, she does report that when she eats dairy containing product she gets a lot of cramping in the upper abdomen, she has never had constipation problems she does not take opiates she has not had travel and she is not vomiting.  Her abdominal exam is benign, check acute abdominal series but anticipate discharge with increasing regimen for constipation.  The patient is agreeable ? ? ?Additional history obtained: ? ?Additional history obtained from medical record including the urgent care visit yesterday and multiple office visits over the past several months, has suffered  with constipation, tonsillitis, nausea and vomiting over the last several months and actually spent some time on Imodium ?External records from outside source obtained and reviewed including that as above ? ? ?Imaging Studies ordered: ? ?I ordered imaging studies including acute abdominal series ?I independently visualized and interpreted imaging which showed moderate colonic stool burden, no signs of obstruction or free air ?I agree with the radiologist interpretation ? ? ?Medicines ordered and prescription drug management: ? ?I ordered medication including Zofran and GoLytely\MiraLAX for constipation and home treatment ?Reevaluation of the patient after these medicines showed that the patient  improved ?I have reviewed the patients home medicines and have made adjustments as needed ? ? ?Problem List / ED Course: ? ?Constipation, outpatient regimen recommended, patient agreeable ? ? ?Social Determinants of Health: ? ?None ? ? ? ? ? ? ? ? ? ?Final Clinical Impression(s) / ED Diagnoses ?Final diagnoses:  ?Constipation, unspecified constipation type  ? ? ?Rx / DC Orders ?ED Discharge Orders   ? ?      Ordered  ?  promethazine (PHENERGAN) 25 MG tablet  Every 6 hours PRN,   Status:  Discontinued       ? 08/02/21 0737  ?  promethazine (PHENERGAN) 25 MG suppository  Every 6 hours PRN,   Status:  Discontinued       ? 08/02/21 0737  ?  ondansetron (ZOFRAN) 4 MG tablet  Every 6 hours       ? 08/02/21 0737  ?  polyethylene glycol (GOLYTELY) 236 g solution   Once       ? 08/02/21 0518  ? ?  ?  ? ?  ? ? ?  ?Eber Hong, MD ?08/02/21 0830 ? ?

## 2021-08-02 NOTE — ED Triage Notes (Signed)
Pt c/o constipation. Reports no BM in the last 2 weeks. Pt went to UC yesterday and reports provider was concerned for blockage. Pt given Mirlax and took 3 doses with no relief.  ?

## 2021-08-02 NOTE — Discharge Instructions (Addendum)
Your testing today in fact shows that you have constipation.  There is no other complicating factors, there is no signs of a bowel blockage or obstruction.  You will need to take increasing doses of a laxative.  I have prescribed a medication called GoLytely, essentially this is multiple doses of MiraLAX and a jug of liquid.  If you would like to increase the speed at which you have a large bowel movement you will need to drink this whole jug of liquid over 24 hours.  Please be aware that this may cause quite a bit of diarrhea.  Do not take Imodium or other diarrhea medicine if that occurs, let this flow through you.  Drink plenty of clear liquids to stay hydrated. ? ?I did prescribe a medication called Zofran which may help with nausea ? ?I would encourage you to return to the emergency department for severe worsening pain vomiting or other complicating factors otherwise you can follow-up with your doctor in the office. ?

## 2021-09-17 ENCOUNTER — Ambulatory Visit
Admission: EM | Admit: 2021-09-17 | Discharge: 2021-09-17 | Disposition: A | Discharge status: Home / Self Care | Payer: Medicaid Other | Attending: Family Medicine | Admitting: Family Medicine

## 2021-09-17 DIAGNOSIS — R112 Nausea with vomiting, unspecified: Secondary | ICD-10-CM | POA: Diagnosis not present

## 2021-09-17 DIAGNOSIS — N946 Dysmenorrhea, unspecified: Secondary | ICD-10-CM | POA: Diagnosis not present

## 2021-09-17 DIAGNOSIS — R519 Headache, unspecified: Secondary | ICD-10-CM

## 2021-09-17 LAB — POCT URINE PREGNANCY: Preg Test, Ur: NEGATIVE

## 2021-09-17 LAB — POCT URINALYSIS DIP (MANUAL ENTRY)
Bilirubin, UA: NEGATIVE
Glucose, UA: NEGATIVE mg/dL
Ketones, POC UA: NEGATIVE mg/dL
Leukocytes, UA: NEGATIVE
Nitrite, UA: NEGATIVE
Protein Ur, POC: 30 mg/dL — AB
Spec Grav, UA: >=1.03 — AB (ref 1.010–1.025)
Urobilinogen, UA: 0.2 E.U./dL
pH, UA: 5.5 (ref 5.0–8.0)

## 2021-09-17 MED ORDER — ONDANSETRON 4 MG PO TBDP: 4.0000 mg | ORAL_TABLET | Freq: Three times a day (TID) | ORAL | 0 refills | Status: DC | PRN

## 2021-09-17 MED ORDER — DEXAMETHASONE SODIUM PHOSPHATE 10 MG/ML IJ SOLN
10.0000 mg | Freq: Once | INTRAMUSCULAR | Status: AC
  Administered 2021-09-17: 10 mg via INTRAMUSCULAR

## 2021-09-17 MED ORDER — KETOROLAC TROMETHAMINE 30 MG/ML IJ SOLN
30.0000 mg | Freq: Once | INTRAMUSCULAR | Status: AC
  Administered 2021-09-17: 30 mg via INTRAMUSCULAR

## 2021-09-17 MED ORDER — ONDANSETRON 4 MG PO TBDP
4.0000 mg | ORAL_TABLET | Freq: Once | ORAL | Status: AC
  Administered 2021-09-17: 4 mg via ORAL

## 2021-09-17 NOTE — ED Triage Notes (Signed)
Pt presents with c/o headache for past 2 days, also having severe menstrual cramps not relieved with medication  ?

## 2021-09-17 NOTE — ED Provider Notes (Addendum)
?[facility] ? ? ? ?CSN: [id number] ?Arrival date & time: 09/17/21  1158 ? ? ?  ? ?History   ?Chief Complaint ?Chief Complaint  ?Patient presents with  ? Headache  ? ? ?HPI ?Ann Hopkins is a 21 y.o. female.  ? ?Presenting today with 2-day history of severe headache, nausea, vomiting, fatigue, menstrual cramping and heavy bleeding.  Denies visual change, dizziness, recent head injury, diarrhea, history of migraines.  States she is on oral contraceptives that typically keep her menstrual cycle from starting and this is her first period in about 4 months.  Trying Aleve with only mild temporary relief of symptoms. ? ? ?Past Medical History:  ?Diagnosis Date  ? Medical history non-contributory   ? Seasonal allergies   ? ? ?Patient Active Problem List  ? Diagnosis Date Noted  ? Normal labor 07/10/2018  ? Indication for care in labor and delivery, antepartum 07/03/2018  ? Back pain affecting pregnancy in third trimester 06/29/2018  ? Rh negative state in antepartum period 12/10/2017  ? Asymptomatic bacteriuria during pregnancy in first trimester 12/08/2017  ? ? ?Past Surgical History:  ?Procedure Laterality Date  ? childbirth    ? NO PAST SURGERIES    ? ? ?OB History   ? ? Gravida  ?1  ? Para  ?1  ? Term  ?1  ? Preterm  ?   ? AB  ?   ? Living  ?1  ?  ? ? SAB  ?   ? IAB  ?   ? Ectopic  ?   ? Multiple  ?0  ? Live Births  ?1  ?   ?  ?  ? ? ? ?Home Medications   ? ?Prior to Admission medications   ?Medication Sig Start Date End Date Taking? Authorizing Provider  ?ondansetron (ZOFRAN-ODT) 4 MG disintegrating tablet Take 1 tablet (4 mg total) by mouth every 8 (eight) hours as needed for nausea or vomiting. 09/17/21  Yes Particia Nearing, PA-C  ?Norethindrone Acetate-Ethinyl Estradiol (LOESTRIN) 1.5-30 MG-MCG tablet Take 1 tablet by mouth daily. 03/08/21   Tresea Mall, CNM  ?ondansetron (ZOFRAN) 4 MG tablet Take 1 tablet (4 mg total) by mouth every 6 (six) hours. 08/02/21   Eber Hong, MD  ?polyethylene  glycol (MIRALAX) 17 g packet Take 17 g by mouth daily as needed for mild constipation.    [provider]  ? ? ?Family History ?Family History  ?Problem Relation Age of Onset  ? Cancer Other   ? Cancer Maternal Grandfather   ? Diabetes Maternal Grandfather   ? ? ?Social History ?Social History  ? ?Tobacco Use  ? Smoking status: Never  ?  Passive exposure: Never  ? Smokeless tobacco: Never  ?Vaping Use  ? Vaping Use: Never used  ?Substance Use Topics  ? Alcohol use: No  ? Drug use: No  ? ? ? ?Allergies   ?Latex ? ? ?Review of Systems ?Review of Systems ?Per HPI ? ?Physical Exam ?Triage Vital Signs ?ED Triage Vitals  ?Enc Vitals Group  ?   BP 09/17/21 1359 116/76  ?   Pulse Rate 09/17/21 1359 81  ?   Resp 09/17/21 1359 20  ?   Temp 09/17/21 1359 98.9 ?F (37.2 ?C)  ?   Temp src --   ?   SpO2 09/17/21 1359 95 %  ?   Weight --   ?   Height --   ?   Head Circumference --   ?  Peak Flow --   ?   Pain Score 09/17/21 1357 7  ?   Pain Loc --   ?   Pain Edu? --   ?   Excl. in GC? --   ? ?No data found. ? ?Updated Vital Signs ?BP 116/76   Pulse 81   Temp 98.9 ?F (37.2 ?C)   Resp 20   SpO2 95%  ? ?Visual Acuity ?Right Eye Distance:   ?Left Eye Distance:   ?Bilateral Distance:   ? ?Right Eye Near:   ?Left Eye Near:    ?Bilateral Near:    ? ?Physical Exam ?Vitals and nursing note reviewed.  ?Constitutional:   ?   Appearance: Normal appearance. She is not ill-appearing.  ?HENT:  ?   Head: Atraumatic.  ?   Mouth/Throat:  ?   Mouth: Mucous membranes are moist.  ?Eyes:  ?   Extraocular Movements: Extraocular movements intact.  ?   Conjunctiva/sclera: Conjunctivae normal.  ?   Pupils: Pupils are equal, round, and reactive to light.  ?Cardiovascular:  ?   Rate and Rhythm: Normal rate and regular rhythm.  ?   Heart sounds: Normal heart sounds.  ?Pulmonary:  ?   Effort: Pulmonary effort is normal.  ?   Breath sounds: Normal breath sounds.  ?Abdominal:  ?   General: Bowel sounds are normal. There is no distension.  ?    Palpations: Abdomen is soft.  ?   Tenderness: There is no abdominal tenderness. There is no guarding.  ?Musculoskeletal:     ?   General: Normal range of motion.  ?   Cervical back: Normal range of motion and neck supple.  ?Skin: ?   General: Skin is warm and dry.  ?Neurological:  ?   General: No focal deficit present.  ?   Mental Status: She is alert and oriented to person, place, and time.  ?   Cranial Nerves: No cranial nerve deficit.  ?   Motor: No weakness.  ?Psychiatric:     ?   Mood and Affect: Mood normal.     ?   Thought Content: Thought content normal.     ?   Judgment: Judgment normal.  ? ? ? ?UC Treatments / Results  ?Labs ?(all labs ordered are listed, but only abnormal results are displayed) ?Labs Reviewed  ?POCT URINALYSIS DIP (MANUAL ENTRY) - Abnormal; Notable for the following components:  ?    Result Value  ? Spec Grav, UA >=1.030 (*)   ? Blood, UA large (*)   ? Protein Ur, POC =30 (*)   ? All other components within normal limits  ?POCT URINE PREGNANCY  ? ? ?EKG ? ? ?Radiology ?No results found. ? ?Procedures ?Procedures (including critical care time) ? ?Medications Ordered in UC ?Medications  ?ondansetron (ZOFRAN-ODT) disintegrating tablet 4 mg (has no administration in time range)  ?ketorolac (TORADOL) 30 MG/ML injection 30 mg (has no administration in time range)  ?dexamethasone (DECADRON) injection 10 mg (has no administration in time range)  ? ? ?Initial Impression / Assessment and Plan / UC Course  ?I have reviewed the triage vital signs and the nursing notes. ? ?Pertinent labs & imaging results that were available during my care of the patient were reviewed by me and considered in my medical decision making (see chart for details). ? ?  ? ?Suspect new migraine, menstrual cramps.  We will treat with Zofran, Toradol, Decadron and give Zofran as needed for nausea vomiting.  Urine pregnancy negative,  urinalysis with out evidence of a urinary tract infection.  Discussed fluids, rest, supportive  care and return precautions. ? ?Final Clinical Impressions(s) / UC Diagnoses  ? ?Final diagnoses:  ?Acute nonintractable headache, unspecified headache type  ?Nausea and vomiting, unspecified vomiting type  ?Severe menstrual cramps  ? ?Discharge Instructions   ?None ?  ? ?ED Prescriptions   ? ? Medication Sig Dispense Auth. Provider  ? ondansetron (ZOFRAN-ODT) 4 MG disintegrating tablet Take 1 tablet (4 mg total) by mouth every 8 (eight) hours as needed for nausea or vomiting. 20 tablet Particia Nearing, [place]  ? ?  ? ?PDMP not reviewed this encounter. ?  ?Particia Nearing, PA-C ?09/17/21 1445 ? ?  ?Particia Nearing, [place] ?09/17/21 1446 ? ?

## 2021-11-10 ENCOUNTER — Encounter: Payer: Self-pay | Admitting: Emergency Medicine

## 2021-11-10 ENCOUNTER — Ambulatory Visit
Admission: EM | Admit: 2021-11-10 | Discharge: 2021-11-10 | Disposition: A | Discharge status: Home / Self Care | Payer: Medicaid Other | Attending: Nurse Practitioner | Admitting: Nurse Practitioner

## 2021-11-10 ENCOUNTER — Telehealth: Payer: Self-pay

## 2021-11-10 DIAGNOSIS — J029 Acute pharyngitis, unspecified: Secondary | ICD-10-CM | POA: Insufficient documentation

## 2021-11-10 LAB — POCT RAPID STREP A (OFFICE): Rapid Strep A Screen: NEGATIVE

## 2021-11-10 MED ORDER — LIDOCAINE VISCOUS HCL 2 % MT SOLN: 10.0000 mL | Freq: Four times a day (QID) | OROMUCOSAL | 0 refills | Status: DC | PRN

## 2021-11-10 MED ORDER — LIDOCAINE VISCOUS HCL 2 % MT SOLN: 10.0000 mL | OROMUCOSAL | 0 refills | Status: DC | PRN

## 2021-11-10 NOTE — ED Provider Notes (Signed)
[facility]    CSN: [id number] Arrival date & time: 11/10/21  [id number]      History   Chief Complaint Chief Complaint  Patient presents with   Sore Throat    HPI Ann Hopkins is a 21 y.o. female.   The history is provided by the patient.   Patient presents for complaints of sore throat that has been present for the past 2 days.  Patient also states that she has had a fever as high as 100.2.  Patient states her stepson was recently diagnosed with strep.  She denies headache, nasal congestion, runny nose, wheezing, shortness of breath, abdominal pain, nausea, vomiting, or diarrhea.  Patient was prescribed Tessalon Perles for her cough.  Past Medical History:  Diagnosis Date   Medical history non-contributory    Seasonal allergies     Patient Active Problem List   Diagnosis Date Noted   Normal labor 07/10/2018   Indication for care in labor and delivery, antepartum 07/03/2018   Back pain affecting pregnancy in third trimester 06/29/2018   Rh negative state in antepartum period 12/10/2017   Asymptomatic bacteriuria during pregnancy in first trimester 12/08/2017    Past Surgical History:  Procedure Laterality Date   childbirth     NO PAST SURGERIES      OB History     Gravida  1   Para  1   Term  1   Preterm      AB      Living  1      SAB      IAB      Ectopic      Multiple  0   Live Births  1            Home Medications    Prior to Admission medications   Medication Sig Start Date End Date Taking? Authorizing Provider  lidocaine (XYLOCAINE) 2 % solution Use as directed 10 mLs in the mouth or throat every 6 (six) hours as needed for mouth pain. Gargle and spit 10mL of solution every 6 hours as needed for throat pain. 11/10/21   Orit Sanville-Warren, Ann Haberhristie Hopkins, Ann Hopkins  Norethindrone Acetate-Ethinyl Estradiol (LOESTRIN) 1.5-30 MG-MCG tablet Take 1 tablet by mouth daily. 03/08/21   Tresea MallGledhill, Jane, CNM  ondansetron (ZOFRAN) 4 MG tablet Take 1  tablet (4 mg total) by mouth every 6 (six) hours. 08/02/21   Eber HongMiller, Brian, MD  ondansetron (ZOFRAN-ODT) 4 MG disintegrating tablet Take 1 tablet (4 mg total) by mouth every 8 (eight) hours as needed for nausea or vomiting. 09/17/21   Particia NearingLane, Rachel Elizabeth, PA-C  polyethylene glycol (MIRALAX) 17 g packet Take 17 g by mouth daily as needed for mild constipation.    [provider]    Family History Family History  Problem Relation Age of Onset   Cancer Other    Cancer Maternal Grandfather    Diabetes Maternal Grandfather     Social History Social History   Tobacco Use   Smoking status: Never    Passive exposure: Never   Smokeless tobacco: Never  Vaping Use   Vaping Use: Never used  Substance Use Topics   Alcohol use: No   Drug use: No     Allergies   Latex   Review of Systems Review of Systems Per HPI  Physical Exam Triage Vital Signs ED Triage Vitals  Enc Vitals Group     BP 11/10/21 1009 128/73     Pulse Rate 11/10/21 1009 93  Resp 11/10/21 1009 18     Temp 11/10/21 1009 98.7 F (37.1 C)     Temp Source 11/10/21 1009 Oral     SpO2 11/10/21 1009 98 %     Weight --      Height --      Head Circumference --      Peak Flow --      Pain Score 11/10/21 1010 7     Pain Loc --      Pain Edu? --      Excl. in GC? --    No data found.  Updated Vital Signs BP 128/73 (BP Location: Right Arm)   Pulse 93   Temp 98.7 F (37.1 C) (Oral)   Resp 18   LMP 10/23/2021 (Approximate)   SpO2 98%   Visual Acuity Right Eye Distance:   Left Eye Distance:   Bilateral Distance:    Right Eye Near:   Left Eye Near:    Bilateral Near:     Physical Exam Constitutional:      Appearance: She is well-developed.  HENT:     Head: Normocephalic and atraumatic.     Right Ear: Tympanic membrane and ear canal normal.     Left Ear: Tympanic membrane and ear canal normal.     Nose: No congestion.     Mouth/Throat:     Pharynx: Uvula midline. Pharyngeal swelling,  posterior oropharyngeal erythema and uvula swelling present.     Tonsils: No tonsillar exudate. 1+ on the right. 1+ on the left.  Eyes:     Conjunctiva/sclera: Conjunctivae normal.     Pupils: Pupils are equal, round, and reactive to light.  Neck:     Thyroid: No thyromegaly.     Trachea: No tracheal deviation.  Cardiovascular:     Rate and Rhythm: Normal rate and regular rhythm.     Heart sounds: Normal heart sounds.  Pulmonary:     Effort: Pulmonary effort is normal.     Breath sounds: Normal breath sounds.  Abdominal:     General: Bowel sounds are normal. There is no distension.     Palpations: Abdomen is soft.     Tenderness: There is no abdominal tenderness.  Musculoskeletal:     Cervical back: Normal range of motion and neck supple.  Skin:    General: Skin is warm and dry.  Neurological:     General: No focal deficit present.     Mental Status: She is alert and oriented to person, place, and time.  Psychiatric:        Mood and Affect: Mood normal.        Behavior: Behavior normal.        Thought Content: Thought content normal.        Judgment: Judgment normal.      UC Treatments / Results  Labs (all labs ordered are listed, but only abnormal results are displayed) Labs Reviewed  CULTURE, GROUP A STREP [facility])  POCT RAPID STREP A (OFFICE)    EKG   Radiology No results found.  Procedures Procedures (including critical care time)  Medications Ordered in UC Medications - No data to display  Initial Impression / Assessment and Plan / UC Course  I have reviewed the triage vital signs and the nursing notes.  Pertinent labs & imaging results that were available during my care of the patient were reviewed by me and considered in my medical decision making (see chart for details).  Patient presents for complaints of  sore throat that have been present for the past 2 days.  Rapid strep test is negative today, culture results are pending.  Supportive care  recommendations were suggested for the patient at this time.  We will provide a prescription for viscous lidocaine to help with her throat pain or discomfort.  Patient advised to follow-up if symptoms do not improve. Final Clinical Impressions(s) / UC Diagnoses   Final diagnoses:  Sore throat     Discharge Instructions      The rapid strep test is negative today.  A throat culture has been ordered.  If the results of the culture are positive, you will be contacted and provided treatment at that time. Increase fluids and allow for plenty of rest. Warm salt water gargles 3-4 times daily to help with throat pain or discomfort. May take ibuprofen (3 to 4 tablets or 600 to 800 mg), which will help with pain and swelling every 8 hours as needed with food and water. Recommend a soft diet until your symptoms improve to include soups, broths, yogurts, puddings, warm tea with honey, or cool liquids, which ever is most soothing. Follow-up if your symptoms do not improve within the next 5 to 7 days.      ED Prescriptions     Medication Sig Dispense Auth. Provider   lidocaine (XYLOCAINE) 2 % solution  (Status: Discontinued) Use as directed 10 mLs in the mouth or throat as needed for mouth pain. 75 mL Ann Hopkins, Ann Haber, Ann Hopkins   lidocaine (XYLOCAINE) 2 % solution Use as directed 10 mLs in the mouth or throat every 6 (six) hours as needed for mouth pain. Gargle and spit 67mL of solution every 6 hours as needed for throat pain. 75 mL Ann Hopkins, Ann Haber, Ann Hopkins      PDMP not reviewed this encounter.   Abran Cantor, Ann Hopkins 11/10/21 1115

## 2021-11-10 NOTE — ED Triage Notes (Signed)
Sore throat x 2 days with fever.  Was seen on Tuesday and diagnosed with bronchitis.

## 2021-11-10 NOTE — Discharge Instructions (Addendum)
The rapid strep test is negative today.  A throat culture has been ordered.  If the results of the culture are positive, you will be contacted and provided treatment at that time. Increase fluids and allow for plenty of rest. Warm salt water gargles 3-4 times daily to help with throat pain or discomfort. May take ibuprofen (3 to 4 tablets or 600 to 800 mg), which will help with pain and swelling every 8 hours as needed with food and water. Recommend a soft diet until your symptoms improve to include soups, broths, yogurts, puddings, warm tea with honey, or cool liquids, which ever is most soothing. Follow-up if your symptoms do not improve within the next 5 to 7 days.

## 2021-11-13 LAB — CULTURE, GROUP A STREP (THRC)

## 2021-12-14 ENCOUNTER — Other Ambulatory Visit: Payer: Self-pay

## 2021-12-14 ENCOUNTER — Emergency Department
Admission: EM | Admit: 2021-12-14 | Discharge: 2021-12-14 | Disposition: A | Discharge status: Home / Self Care | Payer: Medicaid Other | Attending: Emergency Medicine | Admitting: Emergency Medicine

## 2021-12-14 DIAGNOSIS — N938 Other specified abnormal uterine and vaginal bleeding: Secondary | ICD-10-CM | POA: Diagnosis not present

## 2021-12-14 DIAGNOSIS — N939 Abnormal uterine and vaginal bleeding, unspecified: Secondary | ICD-10-CM

## 2021-12-14 DIAGNOSIS — N920 Excessive and frequent menstruation with regular cycle: Secondary | ICD-10-CM

## 2021-12-14 LAB — COMPREHENSIVE METABOLIC PANEL
ALT: 25 U/L (ref 0–44)
AST: 20 U/L (ref 15–41)
Albumin: 4.6 g/dL (ref 3.5–5.0)
Alkaline Phosphatase: 46 U/L (ref 38–126)
Anion gap: 6 (ref 5–15)
BUN: 10 mg/dL (ref 6–20)
CO2: 24 mmol/L (ref 22–32)
Calcium: 9.3 mg/dL (ref 8.9–10.3)
Chloride: 108 mmol/L (ref 98–111)
Creatinine, Ser: 0.63 mg/dL (ref 0.44–1.00)
GFR, Estimated: >60 mL/min (ref >60)
Glucose, Bld: 99 mg/dL (ref 70–99)
Potassium: 4 mmol/L (ref 3.5–5.1)
Sodium: 138 mmol/L (ref 135–145)
Total Bilirubin: 0.8 mg/dL (ref 0.3–1.2)
Total Protein: 7.8 g/dL (ref 6.5–8.1)

## 2021-12-14 LAB — CBC
HCT: 42.7 % (ref 36.0–46.0)
Hemoglobin: 14.5 g/dL (ref 12.0–15.0)
MCH: 30.1 pg (ref 26.0–34.0)
MCHC: 34 g/dL (ref 30.0–36.0)
MCV: 88.6 fL (ref 80.0–100.0)
Platelets: 239 10*3/uL (ref 150–400)
RBC: 4.82 MIL/uL (ref 3.87–5.11)
RDW: 11.9 % (ref 11.5–15.5)
WBC: 6.4 10*3/uL (ref 4.0–10.5)
nRBC: 0 % (ref 0.0–0.2)

## 2021-12-14 LAB — POC URINE PREG, ED: Preg Test, Ur: NEGATIVE

## 2021-12-14 MED ORDER — NORETHIN ACE-ETH ESTRAD-FE 1-20 MG-MCG PO TABS: 1.0000 | ORAL_TABLET | Freq: Every day | ORAL | 1 refills | Status: DC

## 2021-12-14 NOTE — ED Triage Notes (Signed)
Pt woke up around 7am this morning to puddle of blood; pt denies taking birth control; pt reports hasn't had period in past 6 months; pt denies pregnancy; denies PCOS; pt reports this is way more blood than she has had with menstrual cycle in past; denies blood thinners/anticoagulants; c/o lower abdominal cramping; pt confirms dizziness and chills; denies fatigue; pt's resp reg/unlabored, skin dry and is calm.

## 2021-12-14 NOTE — ED Notes (Signed)
Patient ambulated to hallway bathroom with mother standing by to change into hospital pants, briefs and a pad. Patient bled through her own clothes.. Red blood noted on chux on stretcher. Patient denied dizziness when standing up in the room.

## 2021-12-14 NOTE — ED Provider Notes (Addendum)
[facility] Provider Note    Event Date/Time   First MD Initiated Contact with Patient 12/14/21 1216     (approximate)   History   Vaginal Bleeding   HPI Ann Hopkins is a 21 y.o. female has been experiencing heavy vaginal bleeding starting this morning.  Reports that she had had a period for approximately 6 months, then in the middle of June or so she stopped taking her birth control after she got married.  She is not intending to have children right away but thought that since married she would not need to be on birth control longer.  She started experiencing crampy lower abdominal pain like that of a menstrual cycle and heavy bleeding with some clot passage this morning.  The bleeding has started to taper off now  No fevers no nausea or vomiting.  Follows with [facility]  Denies pregnancy   No shortness of breath or lightheadedness  Has been going through about 1 pad per hour  Physical Exam   Triage Vital Signs: ED Triage Vitals  Enc Vitals Group     BP 12/14/21 1029 119/82     Pulse Rate 12/14/21 1027 67     Resp 12/14/21 1027 16     Temp 12/14/21 1027 98.9 F (37.2 C)     Temp Source 12/14/21 1027 Oral     SpO2 12/14/21 1027 96 %     Weight 12/14/21 1028 140 lb (63.5 kg)     Height 12/14/21 1028 5' (1.524 m)     Head Circumference --      Peak Flow --      Pain Score 12/14/21 1050 2     Pain Loc --      Pain Edu? --      Excl. in GC? --     Most recent vital signs: Vitals:   12/14/21 1230 12/14/21 1300  BP: 116/72 121/77  Pulse: 63 63  Resp: 16   Temp:    SpO2: 99% 100%     General: Awake, no distress.  Does not appear pale.  Normal skin tone. CV:  Good peripheral perfusion.  Resp:  Normal effort.  Abd:  No distention.  Slight discomfort suprapubic region.  No rebound or guarding.  No pain through other quadrants.  No pain McBurney's point Other:     ED Results / Procedures / Treatments   Labs (all  labs ordered are listed, but only abnormal results are displayed) Labs Reviewed  CBC  COMPREHENSIVE METABOLIC PANEL  POC URINE PREG, ED  TYPE AND SCREEN     EKG     RADIOLOGY  No results found.  Transvaginal ultrasound was ordered and patient was amenable to receiving, but after waiting she told me that she had a very important appointment and a Education officer, community that she needed to attend and that she could not stay for further testing  I discussed with her the importance of the ultrasound and that would help [place] understand better the cause for her bleeding, but she declined this.  Via shared medical decision making and discussing that I would not be able to assess her ovaries for evidence of cyst, ruptured cyst, twisted or torsed ovary, or other findings like fibroid or endometriosis which would be helpful in understanding the cause for her bleeding and she declined this.  With shared medical decision making and discussing risks and benefits I think this is reasonable, she relates that she has an appointment she  absolutely must attend to at 4 PM.  She cannot wait for additional imaging but her bleeding is markedly improving   PROCEDURES:  Critical Care performed: No  Procedures   MEDICATIONS ORDERED IN ED: Medications - No data to display   IMPRESSION / MDM / ASSESSMENT AND PLAN / ED COURSE  I reviewed the triage vital signs and the nursing notes.                              Differential diagnosis includes, but is not limited to, dysfunctional uterine bleeding, menorrhagia, symptoms secondary to withdrawal of birth control, fibroids endometriosis hemorrhagic cyst, torsion though by clinical history this seems to be low risk as she lacks severe or obvious adnexal pain by clinical history.  She is not pregnant and denies any trauma  Patient's presentation is most consistent with acute complicated illness / injury requiring diagnostic workup.   Labs interpreted as normal CBC and  metabolic panel  Discussed with on-call for Arlina Robes as directed by Dr. Dalbert Garnet - attending, and she recommends patient be started on oral contraceptive once again recommending use of Loestrin FE.  Discussed with patient she is comfortable with returning to use of birth control, advised that she is not attempting to become pregnant at this time but it simply stopped because she was now married.  However she is willing to restart this and follow-up closely with [facility].  Discussed careful return precautions of vaginal bleeding return precautions which patient is agreeable with.  Given patient noting that bleeding has significantly improved now, and comfortable with return precautions which I also discussed with her grandmother who is she had also invited into treatment  Return precautions and treatment recommendations and follow-up discussed with the patient who is agreeable with the plan.  Patient ambulatory without distress at time of discharge  FINAL CLINICAL IMPRESSION(S) / ED DIAGNOSES   Final diagnoses:  Dysfunctional uterine bleeding     Rx / DC Orders   ED Discharge Orders     None        Note:  This document was prepared using Dragon voice recognition software and may include unintentional dictation errors.   Sharyn Creamer, MD 12/14/21 2030    Sharyn Creamer, MD 12/14/21 2030

## 2021-12-14 NOTE — Discharge Instructions (Signed)
Please follow up closely with obstetrics and gynecology, call to schedule.  Return to the emergency room if your bleeding worsens, you become weak and dizzy or lightheaded, you have an episode of passing out, develop severe bleeding such as more than 1 soaked pad per hour for more than 3 straight hours, develop abdominal or pelvic pain, fevers chills or other new concerns arise.

## 2021-12-16 LAB — TYPE AND SCREEN
ABO/RH(D): A NEG
Antibody Screen: POSITIVE
Unit division: 0
Unit division: 0

## 2021-12-16 LAB — BPAM RBC
Blood Product Expiration Date: 202307252359
Blood Product Expiration Date: 202307252359
Unit Type and Rh: 600
Unit Type and Rh: 600

## 2022-01-03 ENCOUNTER — Encounter: Payer: Self-pay | Admitting: Obstetrics & Gynecology

## 2022-01-08 ENCOUNTER — Other Ambulatory Visit: Payer: Self-pay

## 2022-01-08 NOTE — Telephone Encounter (Signed)
Patient called office requesting antibiotic to treat for UTI symptoms. KW

## 2022-01-08 NOTE — Telephone Encounter (Signed)
Contacted patient back to further triage and she complained of symptoms of frequency and dysuria, patient stated that she started a new job and unable to get off and requested antibiotic. I advised patient that a nurse visit would have to be scheduled for urine to be tested for evaluation and treatment. Patient advised that if she is unable to come to office during business hours to go to nearest urgent care for treatment. I encouraged patient to continue to drink  plenty of fluids and cranberry juice, patient was advised of otc Azo for urinary relief to help temporarily relieve symptoms. Patient verbalized understanding. KW

## 2022-01-17 ENCOUNTER — Other Ambulatory Visit: Payer: Self-pay | Admitting: Advanced Practice Midwife

## 2022-01-17 DIAGNOSIS — Z3009 Encounter for other general counseling and advice on contraception: Secondary | ICD-10-CM

## 2022-02-25 ENCOUNTER — Ambulatory Visit: Payer: Self-pay

## 2022-02-25 ENCOUNTER — Ambulatory Visit
Admission: EM | Admit: 2022-02-25 | Discharge: 2022-02-25 | Disposition: A | Discharge status: Home / Self Care | Payer: Medicaid Other | Attending: Nurse Practitioner | Admitting: Nurse Practitioner

## 2022-02-25 DIAGNOSIS — J069 Acute upper respiratory infection, unspecified: Secondary | ICD-10-CM | POA: Diagnosis not present

## 2022-02-25 DIAGNOSIS — Z1152 Encounter for screening for COVID-19: Secondary | ICD-10-CM | POA: Diagnosis not present

## 2022-02-25 LAB — RESP PANEL BY RT-PCR (FLU A&B, COVID) ARPGX2
Influenza A by PCR: NEGATIVE
Influenza B by PCR: NEGATIVE
SARS Coronavirus 2 by RT PCR: NEGATIVE

## 2022-02-25 MED ORDER — BENZONATATE 100 MG PO CAPS: 100.0000 mg | ORAL_CAPSULE | Freq: Three times a day (TID) | ORAL | 0 refills | Status: DC | PRN

## 2022-02-25 NOTE — ED Provider Notes (Signed)
[facility]    CSN: [id number] Arrival date & time: 02/25/22  1521      History   Chief Complaint Chief Complaint  Patient presents with   Cough   Fever    HPI Ann Hopkins is a 21 y.o. female.   Patient presents with 2 days of fever up to 102 F, dry cough, shortness of breath, chest and nasal congestion, runny nose, sneezing, headache, and fatigue.  She denies wheezing, chest pain or tightness, sore throat, ear pain or pressure, abdominal pain, nausea/vomiting, diarrhea, decreased appetite and known sick contacts.  She has taken Mucinex and Tylenol/ibuprofen without much relief.     Past Medical History:  Diagnosis Date   Medical history non-contributory    Seasonal allergies     Patient Active Problem List   Diagnosis Date Noted   Normal labor 07/10/2018   Indication for care in labor and delivery, antepartum 07/03/2018   Back pain affecting pregnancy in third trimester 06/29/2018   Rh negative state in antepartum period 12/10/2017   Asymptomatic bacteriuria during pregnancy in first trimester 12/08/2017    Past Surgical History:  Procedure Laterality Date   childbirth     NO PAST SURGERIES      OB History     Gravida  1   Para  1   Term  1   Preterm      AB      Living  1      SAB      IAB      Ectopic      Multiple  0   Live Births  1            Home Medications    Prior to Admission medications   Medication Sig Start Date End Date Taking? Authorizing Provider  benzonatate (TESSALON) 100 MG capsule Take 1 capsule (100 mg total) by mouth 3 (three) times daily as needed for cough. Do not take with alcohol or while driving or operating heavy machinery 02/25/22  Yes Noemi Chapel A, NP  lidocaine (XYLOCAINE) 2 % solution Use as directed 10 mLs in the mouth or throat every 6 (six) hours as needed for mouth pain. Gargle and spit 41mL of solution every 6 hours as needed for throat pain. 11/10/21   Leath-Warren, Alda Lea, NP  norethindrone-ethinyl estradiol-FE (LOESTRIN FE) 1-20 MG-MCG tablet Take 1 tablet by mouth daily. 12/14/21 02/08/22  Delman Kitten, MD  ondansetron (ZOFRAN) 4 MG tablet Take 1 tablet (4 mg total) by mouth every 6 (six) hours. 08/02/21   Noemi Chapel, MD  ondansetron (ZOFRAN-ODT) 4 MG disintegrating tablet Take 1 tablet (4 mg total) by mouth every 8 (eight) hours as needed for nausea or vomiting. 09/17/21   Volney American, PA-C  polyethylene glycol (MIRALAX) 17 g packet Take 17 g by mouth daily as needed for mild constipation.    [provider]    Family History Family History  Problem Relation Age of Onset   Cancer Other    Cancer Maternal Grandfather    Diabetes Maternal Grandfather     Social History Social History   Tobacco Use   Smoking status: Never    Passive exposure: Never   Smokeless tobacco: Never  Vaping Use   Vaping Use: Never used  Substance Use Topics   Alcohol use: No   Drug use: No     Allergies   Latex   Review of Systems Review of Systems Per HPI  Physical Exam Triage Vital Signs ED Triage Vitals  Enc Vitals Group     BP 02/25/22 1540 126/83     Pulse Rate 02/25/22 1540 (!) 103     Resp 02/25/22 1540 20     Temp 02/25/22 1540 98.4 F (36.9 C)     Temp src --      SpO2 02/25/22 1540 96 %     Weight --      Height --      Head Circumference --      Peak Flow --      Pain Score 02/25/22 1539 8     Pain Loc --      Pain Edu? --      Excl. in [place]? --    No data found.  Updated Vital Signs BP 126/83   Pulse (!) 103   Temp 98.4 F (36.9 C)   Resp 20   LMP 01/29/2022   SpO2 96%   Visual Acuity Right Eye Distance:   Left Eye Distance:   Bilateral Distance:    Right Eye Near:   Left Eye Near:    Bilateral Near:     Physical Exam Vitals and nursing note reviewed.  Constitutional:      General: She is not in acute distress.    Appearance: Normal appearance. She is not ill-appearing or toxic-appearing.  HENT:      Head: Normocephalic and atraumatic.     Right Ear: Tympanic membrane, ear canal and external ear normal.     Left Ear: Tympanic membrane, ear canal and external ear normal.     Nose: No congestion or rhinorrhea.     Mouth/Throat:     Mouth: Mucous membranes are moist.     Pharynx: Oropharynx is clear. Posterior oropharyngeal erythema present. No oropharyngeal exudate.  Eyes:     General: No scleral icterus.    Extraocular Movements: Extraocular movements intact.  Cardiovascular:     Rate and Rhythm: Normal rate and regular rhythm.  Pulmonary:     Effort: Pulmonary effort is normal. No respiratory distress.     Breath sounds: Normal breath sounds. No wheezing, rhonchi or rales.  Abdominal:     General: Abdomen is flat. Bowel sounds are normal. There is no distension.     Palpations: Abdomen is soft.  Musculoskeletal:     Cervical back: Normal range of motion and neck supple.  Lymphadenopathy:     Cervical: No cervical adenopathy.  Skin:    General: Skin is warm and dry.     Coloration: Skin is not jaundiced or pale.     Findings: No erythema or rash.  Neurological:     Mental Status: She is alert and oriented to person, place, and time.  Psychiatric:        Behavior: Behavior is cooperative.      UC Treatments / Results  Labs (all labs ordered are listed, but only abnormal results are displayed) Labs Reviewed  RESP PANEL BY RT-PCR (FLU A&B, COVID) [id number]    EKG   Radiology No results found.  Procedures Procedures (including critical care time)  Medications Ordered in UC Medications - No data to display  Initial Impression / Assessment and Plan / UC Course  I have reviewed the triage vital signs and the nursing notes.  Pertinent labs & imaging results that were available during my care of the patient were reviewed by me and considered in my medical decision making (see chart for details).  Patient is well-appearing, normotensive, afebrile, not tachycardic,  not tachypneic, oxygenating well on room air.  Symptoms are consistent with viral upper respiratory infection.  COVID-19, influenza testing obtained.  Supportive care discussed.  Start [employer] as needed for dry cough.  ER and return precautions discussed.  Note given for school.  The patient was given the opportunity to ask questions.  All questions answered to their satisfaction.  The patient is in agreement to this plan.    Final Clinical Impressions(s) / UC Diagnoses   Final diagnoses:  Encounter for screening for COVID-19  Viral URI with cough     Discharge Instructions      Your symptoms and exam findings are most consistent with a viral upper respiratory infection. These usually run their course in about 10 days.  If your symptoms last longer than 10 days without improvement, please follow up with your primary care provider.  If your symptoms, worsen, please go to the Emergency Room.    We have tested you today for COVID-19 and influenza today.  You will see the results in Mychart and we will call you with positive results.    Please stay home and isolate until you are aware of the results.    Some things that can make you feel better are: - Increased rest - Increasing fluid with water/sugar free electrolytes - Acetaminophen and ibuprofen as needed for fever/pain.  - Salt water gargling, chloraseptic spray and throat lozenges - OTC guaifenesin (Mucinex).  - Saline sinus flushes or a neti pot.  - Humidifying the air. - Tessalon perles every 8 hours as needed for dry cough.      ED Prescriptions     Medication Sig Dispense Auth. Provider   benzonatate (TESSALON) 100 MG capsule Take 1 capsule (100 mg total) by mouth 3 (three) times daily as needed for cough. Do not take with alcohol or while driving or operating heavy machinery 21 capsule Eulogio Bear, NP      PDMP not reviewed this encounter.   Eulogio Bear, NP 02/25/22 1622

## 2022-02-25 NOTE — Discharge Instructions (Addendum)
Your symptoms and exam findings are most consistent with a viral upper respiratory infection. These usually run their course in about 10 days.  If your symptoms last longer than 10 days without improvement, please follow up with your primary care provider.  If your symptoms, worsen, please go to the Emergency Room.    We have tested you today for COVID-19 and influenza today.  You will see the results in Mychart and we will call you with positive results.    Please stay home and isolate until you are aware of the results.    Some things that can make you feel better are: - Increased rest - Increasing fluid with water/sugar free electrolytes - Acetaminophen and ibuprofen as needed for fever/pain.  - Salt water gargling, chloraseptic spray and throat lozenges - OTC guaifenesin (Mucinex).  - Saline sinus flushes or a neti pot.  - Humidifying the air. - Tessalon perles every 8 hours as needed for dry cough.

## 2022-02-25 NOTE — ED Triage Notes (Signed)
Pt presents with c/o cough and fever that began 2 days ago

## 2022-03-08 ENCOUNTER — Ambulatory Visit: Payer: Self-pay

## 2022-03-11 ENCOUNTER — Other Ambulatory Visit: Payer: Self-pay

## 2022-03-11 ENCOUNTER — Emergency Department (HOSPITAL_COMMUNITY): Payer: Medicaid Other

## 2022-03-11 ENCOUNTER — Encounter (HOSPITAL_COMMUNITY): Payer: Self-pay | Admitting: Emergency Medicine

## 2022-03-11 ENCOUNTER — Emergency Department (HOSPITAL_COMMUNITY)
Admission: EM | Admit: 2022-03-11 | Discharge: 2022-03-11 | Disposition: A | Discharge status: Home / Self Care | Payer: Medicaid Other | Attending: Emergency Medicine | Admitting: Emergency Medicine

## 2022-03-11 DIAGNOSIS — R0602 Shortness of breath: Secondary | ICD-10-CM | POA: Diagnosis not present

## 2022-03-11 DIAGNOSIS — N9489 Other specified conditions associated with female genital organs and menstrual cycle: Secondary | ICD-10-CM | POA: Insufficient documentation

## 2022-03-11 DIAGNOSIS — R0981 Nasal congestion: Secondary | ICD-10-CM | POA: Insufficient documentation

## 2022-03-11 DIAGNOSIS — Z91040 Latex allergy status: Secondary | ICD-10-CM | POA: Insufficient documentation

## 2022-03-11 DIAGNOSIS — R059 Cough, unspecified: Secondary | ICD-10-CM | POA: Diagnosis present

## 2022-03-11 DIAGNOSIS — R052 Subacute cough: Secondary | ICD-10-CM

## 2022-03-11 DIAGNOSIS — R079 Chest pain, unspecified: Secondary | ICD-10-CM | POA: Insufficient documentation

## 2022-03-11 LAB — BASIC METABOLIC PANEL
Anion gap: 7 (ref 5–15)
BUN: 13 mg/dL (ref 6–20)
CO2: 25 mmol/L (ref 22–32)
Calcium: 8.8 mg/dL — ABNORMAL LOW (ref 8.9–10.3)
Chloride: 107 mmol/L (ref 98–111)
Creatinine, Ser: 0.58 mg/dL (ref 0.44–1.00)
GFR, Estimated: >60 mL/min (ref >60)
Glucose, Bld: 100 mg/dL — ABNORMAL HIGH (ref 70–99)
Potassium: 3.6 mmol/L (ref 3.5–5.1)
Sodium: 139 mmol/L (ref 135–145)

## 2022-03-11 LAB — CBC WITH DIFFERENTIAL/PLATELET
Abs Immature Granulocytes: 0.06 10*3/uL (ref 0.00–0.07)
Basophils Absolute: 0 10*3/uL (ref 0.0–0.1)
Basophils Relative: 0 %
Eosinophils Absolute: 0.1 10*3/uL (ref 0.0–0.5)
Eosinophils Relative: 1 %
HCT: 42.9 % (ref 36.0–46.0)
Hemoglobin: 14.5 g/dL (ref 12.0–15.0)
Immature Granulocytes: 1 %
Lymphocytes Relative: 21 %
Lymphs Abs: 2.3 10*3/uL (ref 0.7–4.0)
MCH: 30.7 pg (ref 26.0–34.0)
MCHC: 33.8 g/dL (ref 30.0–36.0)
MCV: 90.7 fL (ref 80.0–100.0)
Monocytes Absolute: 0.9 10*3/uL (ref 0.1–1.0)
Monocytes Relative: 8 %
Neutro Abs: 7.5 10*3/uL (ref 1.7–7.7)
Neutrophils Relative %: 69 %
RBC: 4.73 MIL/uL (ref 3.87–5.11)
RDW: 11.9 % (ref 11.5–15.5)
Smear Review: ADEQUATE
WBC: 10.8 10*3/uL — ABNORMAL HIGH (ref 4.0–10.5)
nRBC: 0 % (ref 0.0–0.2)

## 2022-03-11 LAB — D-DIMER, QUANTITATIVE: D-Dimer, Quant: 0.28 ug/mL-FEU (ref 0.00–0.50)

## 2022-03-11 LAB — HCG, QUANTITATIVE, PREGNANCY: hCG, Beta Chain, Quant, S: <1 m[IU]/mL (ref <5)

## 2022-03-11 MED ORDER — BENZONATATE 100 MG PO CAPS: 100.0000 mg | ORAL_CAPSULE | Freq: Three times a day (TID) | ORAL | 0 refills | Status: DC

## 2022-03-11 MED ORDER — ALBUTEROL SULFATE HFA 108 (90 BASE) MCG/ACT IN AERS
2.0000 | INHALATION_SPRAY | RESPIRATORY_TRACT | Status: DC | PRN
  Administered 2022-03-11: 2 via RESPIRATORY_TRACT
  Filled 2022-03-11: qty 6.7

## 2022-03-11 MED ORDER — GUAIFENESIN 100 MG/5ML PO LIQD: 100.0000 mg | ORAL | 0 refills | Status: DC | PRN

## 2022-03-11 NOTE — Discharge Instructions (Addendum)
Please read and follow all provided instructions.  Your diagnoses today include:  1. Subacute cough     Tests performed today include: Chest x-ray - does not show any pneumonia Blood counts and electrolytes: Does not show any serious problems D-dimer: Screening test for blood clot was negative EKG: No abnormal rhythms or signs of heart irritation Vital signs. See below for your results today.   Medications prescribed:  Albuterol inhaler - medication that opens up your airway  Use inhaler as follows: 1-2 puffs with spacer every 4 hours as needed for wheezing, cough, or shortness of breath.   Tessalon Perles - cough suppressant medication  Take any prescribed medications only as directed.  Home care instructions:  Follow any educational materials contained in this packet.  Follow-up instructions: Please follow-up with your primary care provider in the next 3-5 days for further evaluation of your symptoms and a recheck if you are not feeling better.   Return instructions:  Please return to the Emergency Department if you experience worsening symptoms. Please return with worsening wheezing, shortness of breath, or difficulty breathing. Return with persistent fever above 101F.  Please return if you have any other emergent concerns.  Additional Information:  Your vital signs today were: BP 130/80 (BP Location: Right Arm)   Pulse 75   Temp 98.2 F (36.8 C) (Oral)   Resp 14   Ht 5' (1.524 m)   Wt 69.4 kg   LMP 03/01/2022 (Approximate)   SpO2 97%   BMI 29.88 kg/m  If your blood pressure (BP) was elevated above 135/85 this visit, please have this repeated by your doctor within one month. --------------

## 2022-03-11 NOTE — ED Provider Notes (Signed)
[facility] EMERGENCY DEPARTMENT Provider Note   CSN: [id number] Arrival date & time: 03/11/22  1135     History  Chief Complaint  Patient presents with   Cough    Ann Hopkins is a 21 y.o. female.  Patient with no significant past medical history presents to the emergency department for evaluation of cough that has been ongoing for about 3 weeks.  Patient has associated right-sided chest pain, pleuritic in nature, with associated hemoptysis.  She has shortness of breath at times.  No fevers.  She has had some nasal congestion but no ear pain or sore throat.  She was treated with steroids and azithromycin without improvement.  Tested negative for COVID after symptoms began about 3 weeks ago.  Patient denies risk factors for pulmonary embolism including: unilateral leg swelling, history of DVT/PE/other blood clots, use of exogenous hormones (denies OCP), recent immobilizations, recent surgery, recent travel (>4hr segment), malignancy.          Home Medications Prior to Admission medications   Medication Sig Start Date End Date Taking? Authorizing Provider  benzonatate (TESSALON) 100 MG capsule Take 1 capsule (100 mg total) by mouth 3 (three) times daily as needed for cough. Do not take with alcohol or while driving or operating heavy machinery 02/25/22   Cathlean Marseilles A, NP  lidocaine (XYLOCAINE) 2 % solution Use as directed 10 mLs in the mouth or throat every 6 (six) hours as needed for mouth pain. Gargle and spit 65mL of solution every 6 hours as needed for throat pain. 11/10/21   Leath-Warren, Sadie Haber, NP  norethindrone-ethinyl estradiol-FE (LOESTRIN FE) 1-20 MG-MCG tablet Take 1 tablet by mouth daily. 12/14/21 02/08/22  Sharyn Creamer, MD  ondansetron (ZOFRAN) 4 MG tablet Take 1 tablet (4 mg total) by mouth every 6 (six) hours. 08/02/21   Eber Hong, MD  ondansetron (ZOFRAN-ODT) 4 MG disintegrating tablet Take 1 tablet (4 mg total) by mouth every 8 (eight) hours as needed for  nausea or vomiting. 09/17/21   Particia Nearing, PA-C  polyethylene glycol (MIRALAX) 17 g packet Take 17 g by mouth daily as needed for mild constipation.    [provider]      Allergies    Latex    Review of Systems   Review of Systems  Physical Exam Updated Vital Signs BP 130/80 (BP Location: Right Arm)   Pulse 75   Temp 98.2 F (36.8 C) (Oral)   Resp 14   Ht 5' (1.524 m)   Wt 69.4 kg   LMP 03/01/2022 (Approximate)   SpO2 97%   BMI 29.88 kg/m   Physical Exam Vitals and nursing note reviewed.  Constitutional:      General: She is not in acute distress.    Appearance: She is well-developed.  HENT:     Head: Normocephalic and atraumatic.     Right Ear: Tympanic membrane, ear canal and external ear normal.     Left Ear: Tympanic membrane, ear canal and external ear normal.     Nose: Congestion present.     Mouth/Throat:     Mouth: Mucous membranes are moist.  Eyes:     Conjunctiva/sclera: Conjunctivae normal.  Cardiovascular:     Rate and Rhythm: Normal rate and regular rhythm.     Heart sounds: No murmur heard. Pulmonary:     Effort: No respiratory distress.     Breath sounds: No wheezing, rhonchi or rales.     Comments: Lungs clear to auscultation bilaterally, coughing  during exam. Abdominal:     Palpations: Abdomen is soft.     Tenderness: There is no abdominal tenderness. There is no guarding or rebound.  Musculoskeletal:     Cervical back: Normal range of motion and neck supple.     Right lower leg: No edema.     Left lower leg: No edema.  Skin:    General: Skin is warm and dry.     Findings: No rash.  Neurological:     General: No focal deficit present.     Mental Status: She is alert. Mental status is at baseline.     Motor: No weakness.  Psychiatric:        Mood and Affect: Mood normal.     ED Results / Procedures / Treatments   Labs (all labs ordered are listed, but only abnormal results are displayed) Labs Reviewed  CBC WITH  DIFFERENTIAL/PLATELET - Abnormal; Notable for the following components:      Result Value   WBC 10.8 (*)    All other components within normal limits  BASIC METABOLIC PANEL - Abnormal; Notable for the following components:   Glucose, Bld 100 (*)    Calcium 8.8 (*)    All other components within normal limits  D-DIMER, QUANTITATIVE  HCG, QUANTITATIVE, PREGNANCY    ED ECG REPORT   Date: 03/11/2022  Rate: 88  Rhythm: sinus arrhythmia  QRS Axis: normal  Intervals: normal  ST/T Wave abnormalities: normal  Conduction Disutrbances:none  Narrative Interpretation:   Old EKG Reviewed: unchanged  I have personally reviewed the EKG tracing and agree with the computerized printout as noted.   Radiology DG Chest 2 View  Result Date: 03/11/2022 CLINICAL DATA:  Cough and chest discomfort for 1 month. EXAM: CHEST - 2 VIEW COMPARISON:  Chest radiograph 08/02/2021 FINDINGS: The cardiomediastinal silhouette is normal. There is no focal consolidation or pulmonary edema. There is no pleural effusion or pneumothorax There is no acute osseous abnormality. IMPRESSION: No radiographic evidence of acute cardiopulmonary process. Electronically Signed   By: Valetta Mole M.D.   On: 03/11/2022 12:15    Procedures Procedures    Medications Ordered in ED Medications - No data to display  ED Course/ Medical Decision Making/ A&P    Patient seen and examined. History obtained directly from patient.   Labs/EKG: Ordered CBC, BMP, D-dimer, COVID.  EKG personally reviewed and interpreted as above.  Imaging: Ordered chest x-ray which was personally reviewed and interpreted, agree negative.  Medications/Fluids: None ordered  Most recent vital signs reviewed and are as follows: BP 130/80 (BP Location: Right Arm)   Pulse 75   Temp 98.2 F (36.8 C) (Oral)   Resp 14   Ht 5' (1.524 m)   Wt 69.4 kg   LMP 03/01/2022 (Approximate)   SpO2 97%   BMI 29.88 kg/m   Initial impression: Pleuritic chest pain  and cough.  2:42 PM Reassessment performed. Patient appears stable, comfortable.  Labs personally reviewed and interpreted including: CBC with white blood cell count minimally elevated to 10.8 otherwise unremarkable; BMP unremarkable; D-dimer normal at 0.28; negative pregnancy.  Reviewed pertinent lab work and imaging with patient at bedside. Questions answered.   Plan: Discharge to home.   Prescriptions written for: Albuterol inhaler, Tessalon, guaifenesin  Other home care instructions discussed: Rest, avoidance of triggers, NSAIDs for chest discomfort or pleuritic pain.  ED return instructions discussed: Worsening pain, shortness of breath, syncope, fevers.  Follow-up instructions discussed: Patient encouraged to follow-up with their PCP  in 3-5 days.                               Medical Decision Making Amount and/or Complexity of Data Reviewed Labs: ordered. Radiology: ordered.  Risk OTC drugs. Prescription drug management.   Patient with subacute cough ongoing over the past several weeks.  Screen for blood clot today, was negative.  She does have what sounds like residual bronchitis and pleuritic pain, likely pleurisy or musculoskeletal pain due to frequent coughing.  Chest x-ray is clear without signs of pneumonia and she has already completed a course of antibiotics and steroids.  Will try to maximize therapy for cough with albuterol, cough syrup and Tessalon.  Patient looks well and nontoxic.  EKG without any acute findings.  The patient's vital signs, pertinent lab work and imaging were reviewed and interpreted as discussed in the ED course. Hospitalization was considered for further testing, treatments, or serial exams/observation. However as patient is well-appearing, has a stable exam, and reassuring studies today, I do not feel that they warrant admission at this time. This plan was discussed with the patient who verbalizes agreement and comfort with this plan and seems  reliable and able to return to the Emergency Department with worsening or changing symptoms.          Final Clinical Impression(s) / ED Diagnoses Final diagnoses:  Subacute cough    Rx / DC Orders ED Discharge Orders          Ordered    benzonatate (TESSALON) 100 MG capsule  Every 8 hours        03/11/22 1440    guaiFENesin (ROBITUSSIN) 100 MG/5ML liquid  Every 4 hours PRN        03/11/22 1440              Renne Crigler, PA-C 03/11/22 1445    Vanetta Mulders, MD 03/15/22 1915

## 2022-03-11 NOTE — ED Triage Notes (Signed)
Pt presents with cough and right chest discomfort for over 1 month, per pt diagnosed with pneumonia, has completed ABTs on yesterday, but continues to have cough, chest discomfort and coughing up blood.

## 2022-04-22 ENCOUNTER — Ambulatory Visit: Payer: Self-pay

## 2022-06-28 ENCOUNTER — Ambulatory Visit
Admission: EM | Admit: 2022-06-28 | Discharge: 2022-06-28 | Disposition: A | Discharge status: Home / Self Care | Payer: Medicaid Other | Attending: Family Medicine | Admitting: Family Medicine

## 2022-06-28 DIAGNOSIS — R509 Fever, unspecified: Secondary | ICD-10-CM | POA: Insufficient documentation

## 2022-06-28 DIAGNOSIS — R112 Nausea with vomiting, unspecified: Secondary | ICD-10-CM | POA: Insufficient documentation

## 2022-06-28 DIAGNOSIS — Z20822 Contact with and (suspected) exposure to covid-19: Secondary | ICD-10-CM

## 2022-06-28 MED ORDER — ONDANSETRON 4 MG PO TBDP: 4.0000 mg | ORAL_TABLET | Freq: Three times a day (TID) | ORAL | 0 refills | Status: DC | PRN

## 2022-06-28 NOTE — ED Triage Notes (Signed)
Pt reports bodyaches, headache, fever, N/v, and diarrhea, and when she stand it feels like she can't breath x 2 days    Pt did not get her period this month but says she took a preg test and it was neg

## 2022-06-29 LAB — SARS CORONAVIRUS 2 (TAT 6-24 HRS): SARS Coronavirus 2: NEGATIVE

## 2022-06-30 NOTE — ED Provider Notes (Signed)
[facility]    CSN: [id number] Arrival date & time: 06/28/22  1843      History   Chief Complaint Chief Complaint  Patient presents with   Generalized Body Aches    Body pain fever headache diarrhea throwing up - Entered by patient    HPI Ann Hopkins is a 22 y.o. female.   Patient presenting today with 2-day history of fever, chills, body aches, headache, nausea, vomiting, diarrhea.  Denies chest pain, shortness of breath, nasal congestion, cough, sore throat.  So far trying conservative over-the-counter medications with no relief.  She states that she did miss her period this month but took a pregnancy test and it was negative.  Multiple exposures at work to [employer] and a stomach virus.    Past Medical History:  Diagnosis Date   Medical history non-contributory    Seasonal allergies     Patient Active Problem List   Diagnosis Date Noted   Normal labor 07/10/2018   Indication for care in labor and delivery, antepartum 07/03/2018   Back pain affecting pregnancy in third trimester 06/29/2018   Rh negative state in antepartum period 12/10/2017   Asymptomatic bacteriuria during pregnancy in first trimester 12/08/2017    Past Surgical History:  Procedure Laterality Date   childbirth     NO PAST SURGERIES      OB History     Gravida  1   Para  1   Term  1   Preterm      AB      Living  1      SAB      IAB      Ectopic      Multiple  0   Live Births  1            Home Medications    Prior to Admission medications   Medication Sig Start Date End Date Taking? Authorizing Provider  ondansetron (ZOFRAN-ODT) 4 MG disintegrating tablet Take 1 tablet (4 mg total) by mouth every 8 (eight) hours as needed for nausea or vomiting. 06/28/22  Yes Particia Nearing, PA-C  benzonatate (TESSALON) 100 MG capsule Take 1 capsule (100 mg total) by mouth every 8 (eight) hours. 03/11/22   Renne Crigler, PA-C  guaiFENesin (ROBITUSSIN) 100 MG/5ML  liquid Take 5-10 mLs (100-200 mg total) by mouth every 4 (four) hours as needed for cough or to loosen phlegm. 03/11/22   Renne Crigler, PA-C    Family History Family History  Problem Relation Age of Onset   Cancer Other    Cancer Maternal Grandfather    Diabetes Maternal Grandfather     Social History Social History   Tobacco Use   Smoking status: Never    Passive exposure: Never   Smokeless tobacco: Never  Vaping Use   Vaping Use: Never used  Substance Use Topics   Alcohol use: No   Drug use: No     Allergies   Latex   Review of Systems Review of Systems Per HPI  Physical Exam Triage Vital Signs ED Triage Vitals  Enc Vitals Group     BP 06/28/22 1858 121/84     Pulse Rate 06/28/22 1858 90     Resp 06/28/22 1858 16     Temp 06/28/22 1858 99.4 F (37.4 C)     Temp Source 06/28/22 1858 Oral     SpO2 06/28/22 1858 98 %     Weight --      Height --  Head Circumference --      Peak Flow --      Pain Score 06/28/22 1901 7     Pain Loc --      Pain Edu? --      Excl. in [place]? --    No data found.  Updated Vital Signs BP 121/84 (BP Location: Right Arm)   Pulse 90   Temp 99.4 F (37.4 C) (Oral)   Resp 16   LMP 05/17/2022   SpO2 98%   Visual Acuity Right Eye Distance:   Left Eye Distance:   Bilateral Distance:    Right Eye Near:   Left Eye Near:    Bilateral Near:     Physical Exam Vitals and nursing note reviewed.  Constitutional:      Appearance: Normal appearance.  HENT:     Head: Atraumatic.     Right Ear: Tympanic membrane and external ear normal.     Left Ear: Tympanic membrane and external ear normal.     Nose: Nose normal.     Mouth/Throat:     Mouth: Mucous membranes are moist.  Eyes:     Extraocular Movements: Extraocular movements intact.     Conjunctiva/sclera: Conjunctivae normal.  Cardiovascular:     Rate and Rhythm: Normal rate and regular rhythm.     Heart sounds: Normal heart sounds.  Pulmonary:     Effort:  Pulmonary effort is normal.     Breath sounds: Normal breath sounds. No wheezing or rales.  Abdominal:     General: Bowel sounds are normal. There is no distension.     Palpations: Abdomen is soft.     Tenderness: There is no abdominal tenderness. There is no right CVA tenderness, left CVA tenderness or guarding.  Musculoskeletal:        General: Normal range of motion.     Cervical back: Normal range of motion and neck supple.  Skin:    General: Skin is warm and dry.  Neurological:     Mental Status: She is alert and oriented to person, place, and time.  Psychiatric:        Mood and Affect: Mood normal.        Thought Content: Thought content normal.      UC Treatments / Results  Labs (all labs ordered are listed, but only abnormal results are displayed) Labs Reviewed  SARS CORONAVIRUS 2 (TAT 6-24 HRS)    EKG   Radiology No results found.  Procedures Procedures (including critical care time)  Medications Ordered in UC Medications - No data to display  Initial Impression / Assessment and Plan / UC Course  I have reviewed the triage vital signs and the nursing notes.  Pertinent labs & imaging results that were available during my care of the patient were reviewed by me and considered in my medical decision making (see chart for details).     Treat symptoms with Zofran, brat diet, fluids for suspected viral GI illness, COVID test pending for further rule out given her work exposures.  She declines urine pregnancy today as home test was negative.  Discussed supportive home care and return precautions.  Note given.  Final Clinical Impressions(s) / UC Diagnoses   Final diagnoses:  Fever, unspecified  Exposure to COVID-19 virus  Nausea and vomiting, unspecified vomiting type   Discharge Instructions   None    ED Prescriptions     Medication Sig Dispense Auth. Provider   ondansetron (ZOFRAN-ODT) 4 MG disintegrating tablet Take 1  tablet (4 mg total) by mouth  every 8 (eight) hours as needed for nausea or vomiting. 20 tablet Volney American, [place]      PDMP not reviewed this encounter.   Volney American, [place] 06/30/22 1410

## 2022-08-04 ENCOUNTER — Ambulatory Visit
Admission: EM | Admit: 2022-08-04 | Discharge: 2022-08-04 | Disposition: A | Discharge status: Home / Self Care | Payer: Medicaid Other | Attending: Nurse Practitioner | Admitting: Nurse Practitioner

## 2022-08-04 ENCOUNTER — Encounter: Payer: Self-pay | Admitting: Emergency Medicine

## 2022-08-04 DIAGNOSIS — B349 Viral infection, unspecified: Secondary | ICD-10-CM

## 2022-08-04 DIAGNOSIS — Z1152 Encounter for screening for COVID-19: Secondary | ICD-10-CM | POA: Diagnosis not present

## 2022-08-04 LAB — POCT INFLUENZA A/B
Influenza A, POC: NEGATIVE
Influenza B, POC: NEGATIVE

## 2022-08-04 MED ORDER — FLUTICASONE PROPIONATE 50 MCG/ACT NA SUSP: 2.0000 | Freq: Every day | NASAL | 0 refills | Status: DC

## 2022-08-04 NOTE — ED Provider Notes (Signed)
[facility]    CSN: [id number] Arrival date & time: 08/04/22  1124      History   Chief Complaint Chief Complaint  Patient presents with   Nasal Congestion    Head ache , fever, sinus problems - Entered by patient    HPI Ann Hopkins is a 22 y.o. female.   The history is provided by the patient.   She presents for complaints of fever, headache, and nasal congestion that been present for the past 3 days.  Patient states her fever did not start until this morning, and was around 100.  Patient denies sore throat, ear pain, cough, abdominal pain, nausea, vomiting, or diarrhea.  Patient's daughter was diagnosed with influenza on 07/29/2022.  Patient states she has been taking over-the-counter medication for her symptoms.  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  Past Medical History:  Diagnosis Date   Medical history non-contributory    Seasonal allergies     Patient Active Problem List   Diagnosis Date Noted   Normal labor 07/10/2018   Indication for care in labor and delivery, antepartum 07/03/2018   Back pain affecting pregnancy in third trimester 06/29/2018   Rh negative state in antepartum period 12/10/2017   Asymptomatic bacteriuria during pregnancy in first trimester 12/08/2017    Past Surgical History:  Procedure Laterality Date   childbirth     NO PAST SURGERIES      OB History     Gravida  1   Para  1   Term  1   Preterm      AB      Living  1      SAB      IAB      Ectopic      Multiple  0   Live Births  1            Home Medications    Prior to Admission medications   Medication Sig Start Date End Date Taking? Authorizing Provider  fluticasone (FLONASE) 50 MCG/ACT nasal spray Place 2 sprays into both nostrils daily. 08/04/22  Yes Laporsche Hoeger-Warren, Alda Lea, NP  benzonatate (TESSALON) 100 MG capsule Take 1 capsule (100 mg total) by mouth every 8 (eight) hours. 03/11/22   Carlisle Cater, PA-C  guaiFENesin  (ROBITUSSIN) 100 MG/5ML liquid Take 5-10 mLs (100-200 mg total) by mouth every 4 (four) hours as needed for cough or to loosen phlegm. 03/11/22   Carlisle Cater, PA-C  ondansetron (ZOFRAN-ODT) 4 MG disintegrating tablet Take 1 tablet (4 mg total) by mouth every 8 (eight) hours as needed for nausea or vomiting. 06/28/22   Volney American, PA-C    Family History Family History  Problem Relation Age of Onset   Cancer Other    Cancer Maternal Grandfather    Diabetes Maternal Grandfather     Social History Social History   Tobacco Use   Smoking status: Never    Passive exposure: Never   Smokeless tobacco: Never  Vaping Use   Vaping Use: Never used  Substance Use Topics   Alcohol use: No   Drug use: No     Allergies   Latex   Review of Systems Review of Systems Per HPI  Physical Exam Triage Vital Signs ED Triage Vitals  Enc Vitals Group     BP 08/04/22 1215 128/73     Pulse Rate 08/04/22 1215 92     Resp 08/04/22 1215 18     Temp  08/04/22 1215 98.4 F (36.9 C)     Temp Source 08/04/22 1215 Oral     SpO2 08/04/22 1215 97 %     Weight --      Height --      Head Circumference --      Peak Flow --      Pain Score 08/04/22 1216 0     Pain Loc --      Pain Edu? --      Excl. in [place]? --    No data found.  Updated Vital Signs BP 128/73 (BP Location: Right Arm)   Pulse 92   Temp 98.4 F (36.9 C) (Oral)   Resp 18   LMP 06/22/2022 (Exact Date)   SpO2 97%   Visual Acuity Right Eye Distance:   Left Eye Distance:   Bilateral Distance:    Right Eye Near:   Left Eye Near:    Bilateral Near:     Physical Exam Vitals and nursing note reviewed.  Constitutional:      General: She is not in acute distress.    Appearance: Normal appearance.  HENT:     Head: Normocephalic.     Right Ear: Tympanic membrane, ear canal and external ear normal.     Left Ear: Tympanic membrane, ear canal and external ear normal.     Nose: Nose normal.     Mouth/Throat:      Mouth: Mucous membranes are moist.     Pharynx: Posterior oropharyngeal erythema present.  Eyes:     Extraocular Movements: Extraocular movements intact.     Conjunctiva/sclera: Conjunctivae normal.     Pupils: Pupils are equal, round, and reactive to light.  Cardiovascular:     Rate and Rhythm: Normal rate and regular rhythm.     Pulses: Normal pulses.     Heart sounds: Normal heart sounds.  Pulmonary:     Effort: Pulmonary effort is normal. No respiratory distress.     Breath sounds: Normal breath sounds. No stridor. No wheezing, rhonchi or rales.  Abdominal:     General: Bowel sounds are normal.     Palpations: Abdomen is soft.     Tenderness: There is no abdominal tenderness.  Musculoskeletal:     Cervical back: Normal range of motion and neck supple.  Skin:    General: Skin is warm and dry.  Neurological:     General: No focal deficit present.     Mental Status: She is alert and oriented to person, place, and time.  Psychiatric:        Mood and Affect: Mood normal.        Behavior: Behavior normal.      UC Treatments / Results  Labs (all labs ordered are listed, but only abnormal results are displayed) Labs Reviewed  SARS CORONAVIRUS 2 (TAT 6-24 HRS)  POCT INFLUENZA A/B    EKG   Radiology No results found.  Procedures Procedures (including critical care time)  Medications Ordered in UC Medications - No data to display  Initial Impression / Assessment and Plan / UC Course  I have reviewed the triage vital signs and the nursing notes.  Pertinent labs & imaging results that were available during my care of the patient were reviewed by me and considered in my medical decision making (see chart for details).  The patient is well-appearing, she is in no acute distress, vital signs are stable.  The influenza test was negative, a COVID test is pending.  Patient is  a candidate to receive Paxlovid if her COVID test is positive.  Symptomatic treatment was provided  with fluticasone 50 mcg nasal spray to help with her nasal congestion and headache.  Supportive care recommendations were provided to the patient along with indications of when follow-up may be necessary.  Patient is in agreement with this plan of care and verbalizes understanding.  All questions were answered.  Patient stable for discharge.  Note was provided for work. Final Clinical Impressions(s) / UC Diagnoses   Final diagnoses:  Viral illness  Encounter for screening for COVID-19     Discharge Instructions      The flu test was negative.  A COVID test is pending.  You will be contacted if the COVID test is positive.  You are a candidate to receive Paxlovid if your COVID test is positive. Take medication as prescribed. Increase fluids and allow for plenty of rest. May take over-the-counter Tylenol or ibuprofen as needed for pain, fever, or general discomfort. If you develop a cough, recommend using a humidifier in your bedroom at nighttime during sleep and sleeping elevated on pillows while cough symptoms persist. Please be advised that a viral illness can last from 7 to 14 days.  If your symptoms suddenly worsen before that time, or extend beyond that time, please follow-up in this clinic or with your primary care physician for further evaluation. Follow-up as needed.     ED Prescriptions     Medication Sig Dispense Auth. Provider   fluticasone (FLONASE) 50 MCG/ACT nasal spray Place 2 sprays into both nostrils daily. 16 g Megen Madewell-Warren, Alda Lea, NP      PDMP not reviewed this encounter.   Tish Men, NP 08/04/22 1258

## 2022-08-04 NOTE — Discharge Instructions (Addendum)
The flu test was negative.  A COVID test is pending.  You will be contacted if the COVID test is positive.  You are a candidate to receive Paxlovid if your COVID test is positive. Take medication as prescribed. Increase fluids and allow for plenty of rest. May take over-the-counter Tylenol or ibuprofen as needed for pain, fever, or general discomfort. If you develop a cough, recommend using a humidifier in your bedroom at nighttime during sleep and sleeping elevated on pillows while cough symptoms persist. Please be advised that a viral illness can last from 7 to 14 days.  If your symptoms suddenly worsen before that time, or extend beyond that time, please follow-up in this clinic or with your primary care physician for further evaluation. Follow-up as needed.

## 2022-08-04 NOTE — ED Triage Notes (Signed)
Fever, headache, nasal congestion x 3 days.

## 2022-08-05 LAB — SARS CORONAVIRUS 2 (TAT 6-24 HRS): SARS Coronavirus 2: NEGATIVE

## 2022-10-15 ENCOUNTER — Emergency Department
Admission: EM | Admit: 2022-10-15 | Discharge: 2022-10-15 | Disposition: A | Discharge status: Home / Self Care | Payer: Medicaid Other | Attending: Emergency Medicine | Admitting: Emergency Medicine

## 2022-10-15 ENCOUNTER — Other Ambulatory Visit: Payer: Self-pay

## 2022-10-15 DIAGNOSIS — J029 Acute pharyngitis, unspecified: Secondary | ICD-10-CM | POA: Diagnosis present

## 2022-10-15 LAB — GROUP A STREP BY PCR: Group A Strep by PCR: NOT DETECTED

## 2022-10-15 MED ORDER — AMOXICILLIN-POT CLAVULANATE 875-125 MG PO TABS: 1.0000 | ORAL_TABLET | Freq: Two times a day (BID) | ORAL | 0 refills | Status: AC

## 2022-10-15 NOTE — ED Triage Notes (Signed)
Pt presents to ED with c/o of sore throat since Sunday. Daughter DX'ed with same recently and currently on ABX. NAD noted.

## 2022-10-15 NOTE — ED Provider Notes (Signed)
[facility] Provider Note    Event Date/Time   First MD Initiated Contact with Patient 10/15/22 [phone]     (approximate)   History   Sore Throat   HPI  Ann Hopkins is a 22 y.o. female with no past medical history, presents today for sore throat x 2 days.  She reports a cough, mild fevers around 100 degrees and congestion.  Denies difficulty swallowing.  Her daughter is sick with the same symptoms has been on antibiotics for a few days for strep throat. She has been managing her symptoms with Tylenol as needed.   Physical Exam   Triage Vital Signs: ED Triage Vitals [10/15/22 0727]  Enc Vitals Group     BP 124/70     Pulse Rate 74     Resp 16     Temp 98.1 F (36.7 C)     Temp Source Oral     SpO2 99 %     Weight      Height      Head Circumference      Peak Flow      Pain Score 4     Pain Loc      Pain Edu?      Excl. in GC?     Most recent vital signs: Vitals:   10/15/22 0727  BP: 124/70  Pulse: 74  Resp: 16  Temp: 98.1 F (36.7 C)  SpO2: 99%    General: Awake, no distress.  ENT:  No redness or swelling of the throat. No visible exudates on tonsils. No uvular deviation. No drooling. No voice change. No tender cervical lymph nodes.  CV:  Good peripheral perfusion.  Resp:  Normal effort.  Abd:  No distention.    ED Results / Procedures / Treatments   Labs (all labs ordered are listed, but only abnormal results are displayed) Labs Reviewed  GROUP A STREP BY PCR     Procedures   MEDICATIONS ORDERED IN ED: Medications - No data to display   IMPRESSION / MDM / ASSESSMENT AND PLAN / ED COURSE  I reviewed the triage vital signs and the nursing notes.                              Differential diagnosis includes, but is not limited to, viral pharyngitis, bacterial pharyngitis, peritonsillar abscess, retropharyngeal abscess.  Patient is non-toxic in appearance. There is no uvular deviation, handling secretions, no voice  change, therefore I do not suspect a peritonsillar or retropharyngeal abscess. Strep swab was obtained in triage and was negative.  However after discussion with patient and given that her daughter tested positive for strep, we will proceed with antibiotics to prevent poststreptococcal complications.  Discussed symptomatic treatment with Tylenol and Motrin, as well as staying hydrated and resting is much as possible.  Patient does not have any allergies to medication.  Patient's presentation is most consistent with acute, uncomplicated illness.     FINAL CLINICAL IMPRESSION(S) / ED DIAGNOSES   Final diagnoses:  Acute pharyngitis, unspecified etiology     Rx / DC Orders   ED Discharge Orders          Ordered    amoxicillin-clavulanate (AUGMENTIN) 875-125 MG tablet  2 times daily        10/15/22 0825             Note:  This document was prepared using Dragon voice recognition  software and may include unintentional dictation errors.   Cruz Condon, PA-C 10/15/22 [id number]    Jene Every, MD 10/15/22 [id number]

## 2022-10-15 NOTE — Discharge Instructions (Addendum)
As we discussed you will take the antibiotics to prevent post-strep complications. Complete the full course of antibiotics.Take tylenol and motrin as needed.

## 2022-11-15 ENCOUNTER — Ambulatory Visit
Admission: RE | Admit: 2022-11-15 | Discharge: 2022-11-15 | Disposition: A | Discharge status: Home / Self Care | Payer: Medicaid Other | Source: Ambulatory Visit | Attending: Urgent Care | Admitting: Urgent Care

## 2022-11-15 VITALS — BP 129/76 | HR 91 | Temp 98.2°F | Resp 18

## 2022-11-15 DIAGNOSIS — R102 Pelvic and perineal pain: Secondary | ICD-10-CM | POA: Diagnosis present

## 2022-11-15 DIAGNOSIS — N912 Amenorrhea, unspecified: Secondary | ICD-10-CM | POA: Insufficient documentation

## 2022-11-15 LAB — POCT URINALYSIS DIP (MANUAL ENTRY)
Bilirubin, UA: NEGATIVE
Blood, UA: NEGATIVE
Glucose, UA: NEGATIVE mg/dL
Ketones, POC UA: NEGATIVE mg/dL
Nitrite, UA: NEGATIVE
Protein Ur, POC: NEGATIVE mg/dL
Spec Grav, UA: 1.02 (ref 1.010–1.025)
Urobilinogen, UA: 0.2 E.U./dL
pH, UA: 7.5 (ref 5.0–8.0)

## 2022-11-15 LAB — POCT URINE PREGNANCY: Preg Test, Ur: NEGATIVE

## 2022-11-15 NOTE — Discharge Instructions (Addendum)
Please follow up with your OB-GYN. Your results with automatically load to your mychart. This is for a blood pregnancy test and the urine culture.

## 2022-11-15 NOTE — ED Provider Notes (Signed)
[facility] [place] [facility]  Note:  This document was prepared using Dragon voice recognition software and may include unintentional dictation errors.  MRN: [id number] DOB: 2001-04-08  Subjective:   Ann Hopkins is a 22 y.o. female presenting for amenorrhea and intermittent abdominal cramping.  Patient reports that she has had multiple negative tests at home but today ended up having one test that had a faint positive line.  She contacted her OB/GYN who advised that she come to our clinic to get blood work done.  Denies fever, n/v, rashes, dysuria, urinary frequency, hematuria, vaginal discharge.  Patient has no concern for urinary tract infection.  No current facility-administered medications for this encounter. No current outpatient medications on file.   Allergies  Allergen Reactions   Latex Rash    Past Medical History:  Diagnosis Date   Medical history non-contributory    Seasonal allergies      Past Surgical History:  Procedure Laterality Date   childbirth     NO PAST SURGERIES      Family History  Problem Relation Age of Onset   Cancer Other    Cancer Maternal Grandfather    Diabetes Maternal Grandfather     Social History   Tobacco Use   Smoking status: Never    Passive exposure: Never   Smokeless tobacco: Never  Vaping Use   Vaping Use: Never used  Substance Use Topics   Alcohol use: No   Drug use: No    ROS   Objective:   Vitals: BP 129/76 (BP Location: Right Arm)   Pulse 91   Temp 98.2 F (36.8 C) (Oral)   Resp 18   LMP 10/09/2022 (Exact Date)   SpO2 97%   Physical Exam Constitutional:      General: She is not in acute distress.    Appearance: Normal appearance. She is well-developed. She is not ill-appearing, toxic-appearing or diaphoretic.  HENT:     Head: Normocephalic and atraumatic.     Right Ear: External ear normal.     Left Ear: External ear normal.     Nose: Nose normal.     Mouth/Throat:     Mouth: Mucous membranes  are moist.  Eyes:     General: No scleral icterus.       Right eye: No discharge.        Left eye: No discharge.     Extraocular Movements: Extraocular movements intact.  Cardiovascular:     Rate and Rhythm: Normal rate.  Pulmonary:     Effort: Pulmonary effort is normal.  Skin:    General: Skin is warm and dry.  Neurological:     General: No focal deficit present.     Mental Status: She is alert and oriented to person, place, and time.  Psychiatric:        Mood and Affect: Mood normal.        Behavior: Behavior normal.     Results for orders placed or performed during the hospital encounter of 11/15/22 (from the past 24 hour(s))  POCT urine pregnancy     Status: None   Collection Time: 11/15/22  1:11 PM  Result Value Ref Range   Preg Test, Ur Negative Negative  POCT urinalysis dipstick     Status: Abnormal   Collection Time: 11/15/22  1:18 PM  Result Value Ref Range   Color, UA yellow yellow   Clarity, UA clear clear   Glucose, UA negative negative mg/dL   Bilirubin, UA negative  negative   Ketones, POC UA negative negative mg/dL   Spec Grav, UA 1.191 4.782 - 1.025   Blood, UA negative negative   pH, UA 7.5 5.0 - 8.0   Protein Ur, POC negative negative mg/dL   Urobilinogen, UA 0.2 0.2 or 1.0 E.U./dL   Nitrite, UA Negative Negative   Leukocytes, UA Trace (A) Negative    Assessment and Plan :   PDMP not reviewed this encounter.  1. Amenorrhea   2. Suprapubic pain    Labs pending, results will populate to MyChart.  Patient is otherwise to follow-up with her OB/GYN as soon as possible.  Recommended Tylenol for her intermittent abdominal cramps.  Patient declined STI testing.   Wallis Bamberg, PA-C 11/15/22 1329

## 2022-11-15 NOTE — ED Triage Notes (Signed)
Wants pregnancy test.  States last period was May 8th.  States has taken home pregnancy tests which have been negative.  States has been having some lower ABD cramping.

## 2022-11-16 LAB — HCG, SERUM, QUALITATIVE: hCG,Beta Subunit,Qual,Serum: NEGATIVE m[IU]/mL (ref <6)

## 2022-11-17 LAB — URINE CULTURE: Culture: 20000 — AB

## 2022-12-16 ENCOUNTER — Ambulatory Visit
Admission: RE | Admit: 2022-12-16 | Discharge: 2022-12-16 | Disposition: A | Discharge status: Home / Self Care | Payer: Medicaid Other | Source: Ambulatory Visit | Attending: Nurse Practitioner | Admitting: Nurse Practitioner

## 2022-12-16 ENCOUNTER — Ambulatory Visit: Payer: Self-pay

## 2022-12-16 VITALS — BP 119/72 | HR 75 | Temp 98.5°F | Resp 16

## 2022-12-16 DIAGNOSIS — N926 Irregular menstruation, unspecified: Secondary | ICD-10-CM | POA: Insufficient documentation

## 2022-12-16 DIAGNOSIS — R109 Unspecified abdominal pain: Secondary | ICD-10-CM | POA: Insufficient documentation

## 2022-12-16 DIAGNOSIS — Z1152 Encounter for screening for COVID-19: Secondary | ICD-10-CM | POA: Diagnosis not present

## 2022-12-16 DIAGNOSIS — J069 Acute upper respiratory infection, unspecified: Secondary | ICD-10-CM | POA: Diagnosis not present

## 2022-12-16 LAB — POCT URINALYSIS DIP (MANUAL ENTRY)
Bilirubin, UA: NEGATIVE
Glucose, UA: NEGATIVE mg/dL
Ketones, POC UA: NEGATIVE mg/dL
Leukocytes, UA: NEGATIVE
Nitrite, UA: NEGATIVE
Protein Ur, POC: NEGATIVE mg/dL
Spec Grav, UA: >=1.03 — AB (ref 1.010–1.025)
Urobilinogen, UA: 0.2 E.U./dL
pH, UA: 6 (ref 5.0–8.0)

## 2022-12-16 LAB — POCT URINE PREGNANCY: Preg Test, Ur: NEGATIVE

## 2022-12-16 MED ORDER — PSEUDOEPH-BROMPHEN-DM 30-2-10 MG/5ML PO SYRP: 5.0000 mL | ORAL_SOLUTION | Freq: Four times a day (QID) | ORAL | 0 refills | Status: DC | PRN

## 2022-12-16 MED ORDER — FLUTICASONE PROPIONATE 50 MCG/ACT NA SUSP: 2.0000 | Freq: Every day | NASAL | 0 refills | Status: DC

## 2022-12-16 NOTE — ED Provider Notes (Signed)
[facility]    CSN: [id number] Arrival date & time: 12/16/22  1220      History   Chief Complaint Chief Complaint  Patient presents with   Cough    Cough, , chills headache fatigue - Entered by patient    HPI MITCHELLE SULTAN is a 22 y.o. female.   The history is provided by the patient.   The patient presents for complaints of fatigue, headache, nasal congestion, and sore throat.  Patient states symptoms have been present for the past 3 days.  She denies fever, chills, ear pain, ear drainage, wheezing, shortness of breath, difficulty breathing, chest pain, vomiting, or diarrhea.  Patient also complains of lower abdominal cramping that is been present for the past 2 weeks.  She states her last menstrual cycle was 10/21/2022.  She also states that she has intermittent nausea, but that it resolves on its own.  Patient denies urinary frequency, urgency, hesitancy, low back pain, gas, bloating, vaginal discharge, vaginal odor, or vaginal itching.  Patient reports she is scheduled to see gynecology in the upcoming weeks.   Past Medical History:  Diagnosis Date   Medical history non-contributory    Seasonal allergies     Patient Active Problem List   Diagnosis Date Noted   Normal labor 07/10/2018   Indication for care in labor and delivery, antepartum 07/03/2018   Back pain affecting pregnancy in third trimester 06/29/2018   Rh negative state in antepartum period 12/10/2017   Asymptomatic bacteriuria during pregnancy in first trimester 12/08/2017    Past Surgical History:  Procedure Laterality Date   childbirth     NO PAST SURGERIES      OB History     Gravida  1   Para  1   Term  1   Preterm      AB      Living  1      SAB      IAB      Ectopic      Multiple  0   Live Births  1            Home Medications    Prior to Admission medications   Not on File    Family History Family History  Problem Relation Age of Onset   Cancer  Other    Cancer Maternal Grandfather    Diabetes Maternal Grandfather     Social History Social History   Tobacco Use   Smoking status: Never    Passive exposure: Never   Smokeless tobacco: Never  Vaping Use   Vaping status: Never Used  Substance Use Topics   Alcohol use: No   Drug use: No     Allergies   Latex   Review of Systems Review of Systems Per HPI  Physical Exam Triage Vital Signs ED Triage Vitals  Encounter Vitals Group     BP 12/16/22 1244 119/72     Systolic BP Percentile --      Diastolic BP Percentile --      Pulse Rate 12/16/22 1244 75     Resp 12/16/22 1244 16     Temp 12/16/22 1244 98.5 F (36.9 C)     Temp Source 12/16/22 1244 Oral     SpO2 12/16/22 1244 97 %     Weight --      Height --      Head Circumference --      Peak Flow --  Pain Score 12/16/22 1245 3     Pain Loc --      Pain Education --      Exclude from Growth Chart --    No data found.  Updated Vital Signs BP 119/72 (BP Location: Right Arm)   Pulse 75   Temp 98.5 F (36.9 C) (Oral)   Resp 16   LMP 10/25/2022   SpO2 97%   Visual Acuity Right Eye Distance:   Left Eye Distance:   Bilateral Distance:    Right Eye Near:   Left Eye Near:    Bilateral Near:     Physical Exam Vitals and nursing note reviewed.  Constitutional:      General: She is not in acute distress.    Appearance: Normal appearance.  HENT:     Head: Normocephalic.     Right Ear: Tympanic membrane, ear canal and external ear normal.     Left Ear: Tympanic membrane, ear canal and external ear normal.     Nose: Congestion present.     Right Turbinates: Enlarged and swollen.     Left Turbinates: Enlarged and swollen.     Right Sinus: No maxillary sinus tenderness or frontal sinus tenderness.     Left Sinus: No maxillary sinus tenderness or frontal sinus tenderness.     Mouth/Throat:     Lips: Pink.     Mouth: Mucous membranes are moist.     Pharynx: Oropharynx is clear. Uvula midline.  Posterior oropharyngeal erythema and postnasal drip present. No pharyngeal swelling, oropharyngeal exudate or uvula swelling.     Comments: Cobblestoning present on posterior oropharynx Eyes:     Extraocular Movements: Extraocular movements intact.     Conjunctiva/sclera: Conjunctivae normal.     Pupils: Pupils are equal, round, and reactive to light.  Cardiovascular:     Rate and Rhythm: Normal rate and regular rhythm.     Pulses: Normal pulses.     Heart sounds: Normal heart sounds.  Pulmonary:     Effort: Pulmonary effort is normal. No respiratory distress.     Breath sounds: Normal breath sounds. No stridor. No wheezing, rhonchi or rales.  Abdominal:     General: Bowel sounds are normal.     Palpations: Abdomen is soft.     Tenderness: There is no abdominal tenderness. There is no right CVA tenderness, left CVA tenderness, guarding or rebound.  Musculoskeletal:     Cervical back: Normal range of motion.  Lymphadenopathy:     Cervical: No cervical adenopathy.  Skin:    General: Skin is warm and dry.  Neurological:     General: No focal deficit present.     Mental Status: She is alert and oriented to person, place, and time.  Psychiatric:        Mood and Affect: Mood normal.        Behavior: Behavior normal.      UC Treatments / Results  Labs (all labs ordered are listed, but only abnormal results are displayed) Labs Reviewed  POCT URINALYSIS DIP (MANUAL ENTRY) - Abnormal; Notable for the following components:      Result Value   Clarity, UA hazy (*)    Spec Grav, UA >=1.030 (*)    Blood, UA trace-intact (*)    All other components within normal limits  POCT URINE PREGNANCY - Abnormal  SARS CORONAVIRUS 2 (TAT 6-24 HRS)    EKG   Radiology No results found.  Procedures Procedures (including critical care time)  Medications Ordered  in UC Medications - No data to display  Initial Impression / Assessment and Plan / UC Course  I have reviewed the triage vital  signs and the nursing notes.  Pertinent labs & imaging results that were available during my care of the patient were reviewed by me and considered in my medical decision making (see chart for details).  The patient is well-appearing, she is in no acute distress, vital signs are stable.  Urine pregnancy test is negative.  Urine culture is pending.  COVID test is also pending.  Patient is a candidate to receive Paxlovid if her COVID test is positive.  Symptomatic treatment provided to the patient to include Bromfed-DM, and fluticasone 50 mcg nasal spray.  Supportive care recommendations were provided and discussed with the patient to include increase fluids, allowing for plenty of rest, over-the-counter analgesics for pain or discomfort, and use of a humidifier during sleep for cough.  Patient was advised she will be contacted if the pending test result is positive.  With regard to her pelvic pain, patient declined cytology testing.  Patient is scheduled to follow-up with gynecology for continued pelvic pain.  Patient is in agreement with this plan of care and verbalizes understanding.  All questions were answered.  Patient stable for discharge.   Final Clinical Impressions(s) / UC Diagnoses   Final diagnoses:  Viral upper respiratory tract infection with cough  Abdominal cramping  Missed period  Encounter for screening for COVID-19   Discharge Instructions   None    ED Prescriptions   None    PDMP not reviewed this encounter.   Abran Cantor, NP 12/16/22 1340

## 2022-12-16 NOTE — Discharge Instructions (Addendum)
The urine pregnancy test and urinalysis are negative.  A urine culture is pending along with a COVID test.  You will be contacted if the pending test results are abnormal. Take medication as prescribed. May take over-the-counter Tylenol or ibuprofen as needed for pain, fever, or general discomfort. Increase fluids and allow for plenty of rest. Recommend using a humidifier in your bedroom at nighttime during sleep or sleeping elevated on pillows while cough symptoms persist. If your COVID test is positive, and you begin treatment, it is recommended that you wear your mask during this 5 days.  If you continue to experience symptoms after completing treatment, you will need to wear your mask for an additional 5 days.  Per CDC guidelines, you are able to participate in your regular activities as long as you have been fever free for 24 hours with no medication.  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  For your abdominal pain: The urinalysis and urine pregnancy test were negative. May take over-the-counter Tylenol or ibuprofen as needed for pain or discomfort. Follow-up with gynecology as scheduled. Go to the emergency department for worsening abdominal pain, or if you develop new symptoms of vaginal bleeding, or other concerns. Follow-up as needed.

## 2022-12-16 NOTE — ED Triage Notes (Signed)
Pt reports she has a stuffy nose, sore throat, and headache, fatigue, abdominal cramping, and cold and sweats x 3 days.     Brother tested positive for covid wants testing.

## 2022-12-17 LAB — SARS CORONAVIRUS 2 (TAT 6-24 HRS): SARS Coronavirus 2: NEGATIVE

## 2022-12-18 LAB — URINE CULTURE: Culture: 5000 — AB

## 2022-12-19 ENCOUNTER — Other Ambulatory Visit (HOSPITAL_COMMUNITY)
Admission: RE | Admit: 2022-12-19 | Discharge: 2022-12-19 | Disposition: A | Discharge status: Home / Self Care | Payer: Medicaid Other | Source: Ambulatory Visit | Attending: Obstetrics & Gynecology | Admitting: Obstetrics & Gynecology

## 2022-12-19 ENCOUNTER — Encounter: Payer: Self-pay | Admitting: Obstetrics & Gynecology

## 2022-12-19 ENCOUNTER — Ambulatory Visit: Payer: Medicaid Other | Admitting: Obstetrics & Gynecology

## 2022-12-19 VITALS — BP 127/82 | HR 79 | Ht 60.0 in | Wt 166.0 lb

## 2022-12-19 DIAGNOSIS — Z3202 Encounter for pregnancy test, result negative: Secondary | ICD-10-CM

## 2022-12-19 DIAGNOSIS — Z124 Encounter for screening for malignant neoplasm of cervix: Secondary | ICD-10-CM | POA: Diagnosis not present

## 2022-12-19 LAB — POCT URINE PREGNANCY: Preg Test, Ur: NEGATIVE

## 2022-12-19 MED ORDER — MEDROXYPROGESTERONE ACETATE 10 MG PO TABS
10.0000 mg | ORAL_TABLET | Freq: Every day | ORAL | 0 refills | Status: DC
Start: 2022-12-19 — End: 2023-10-21

## 2022-12-19 NOTE — Progress Notes (Signed)
Subjective:     Ann Hopkins is a 22 y.o. female here for a routine exam.  Patient's last menstrual period was 10/25/2022. G1P1001 Birth Control Method:  none, been trying for 6 weeks Menstrual Calendar(currently): skipped last period  Current complaints: none.   Current acute medical issues:  none   Recent Gynecologic History Patient's last menstrual period was 10/25/2022. Last Pap: never,   Last mammogram: na,    Past Medical History:  Diagnosis Date   Medical history non-contributory    Seasonal allergies     Past Surgical History:  Procedure Laterality Date   childbirth     NO PAST SURGERIES      OB History     Gravida  1   Para  1   Term  1   Preterm      AB      Living  1      SAB      IAB      Ectopic      Multiple  0   Live Births  1           Social History   Socioeconomic History   Marital status: Married    Spouse name: Not on file   Number of children: Not on file   Years of education: Not on file   Highest education level: Not on file  Occupational History   Not on file  Tobacco Use   Smoking status: Never    Passive exposure: Never   Smokeless tobacco: Never  Vaping Use   Vaping status: Never Used  Substance and Sexual Activity   Alcohol use: No   Drug use: No   Sexual activity: Yes    Birth control/protection: None  Other Topics Concern   Not on file  Social History Narrative   Not on file   Social Determinants of Health   Financial Resource Strain: Low Risk  (07/03/2018)   Overall Financial Resource Strain (CARDIA)    Difficulty of Paying Living Expenses: Not hard at all  Food Insecurity: No Food Insecurity (07/03/2018)   Hunger Vital Sign    Worried About Running Out of Food in the Last Year: Never true    Ran Out of Food in the Last Year: Never true  Transportation Needs: No Transportation Needs (07/03/2018)   PRAPARE - Administrator, Civil Service (Medical): No    Lack of Transportation  (Non-Medical): No  Physical Activity: Inactive (07/03/2018)   Exercise Vital Sign    Days of Exercise per Week: 0 days    Minutes of Exercise per Session: 0 min  Stress: No Stress Concern Present (07/03/2018)   [employer] of Occupational Health - Occupational Stress Questionnaire    Feeling of Stress : Not at all  Social Connections: Unknown (10/16/2021)   Received from [facility]   Social Network    Social Network: Not on file    Family History  Problem Relation Age of Onset   Cancer Other    Cancer Maternal Grandfather    Diabetes Maternal Grandfather      Current Outpatient Medications:    medroxyPROGESTERone (PROVERA) 10 MG tablet, Take 1 tablet (10 mg total) by mouth daily., Disp: 10 tablet, Rfl: 0  Review of Systems  Review of Systems  Constitutional: Negative for fever, chills, weight loss, malaise/fatigue and diaphoresis.  HENT: Negative for hearing loss, ear pain, nosebleeds, congestion, sore throat, neck pain, tinnitus and ear discharge.  Eyes: Negative for blurred vision, double vision, photophobia, pain, discharge and redness.  Respiratory: Negative for cough, hemoptysis, sputum production, shortness of breath, wheezing and stridor.   Cardiovascular: Negative for chest pain, palpitations, orthopnea, claudication, leg swelling and PND.  Gastrointestinal: negative for abdominal pain. Negative for heartburn, nausea, vomiting, diarrhea, constipation, blood in stool and melena.  Genitourinary: Negative for dysuria, urgency, frequency, hematuria and flank pain.  Musculoskeletal: Negative for myalgias, back pain, joint pain and falls.  Skin: Negative for itching and rash.  Neurological: Negative for dizziness, tingling, tremors, sensory change, speech change, focal weakness, seizures, loss of consciousness, weakness and headaches.  Endo/Heme/Allergies: Negative for environmental allergies and polydipsia. Does not bruise/bleed easily.   Psychiatric/Behavioral: Negative for depression, suicidal ideas, hallucinations, memory loss and substance abuse. The patient is not nervous/anxious and does not have insomnia.        Objective:  Blood pressure 127/82, pulse 79, height 5' (1.524 m), weight 166 lb (75.3 kg), last menstrual period 10/25/2022.   Physical Exam  Vitals reviewed. Constitutional: She is oriented to person, place, and time. She appears well-developed and well-nourished.  HENT:  Head: Normocephalic and atraumatic.        Right Ear: External ear normal.  Left Ear: External ear normal.  Nose: Nose normal.  Mouth/Throat: Oropharynx is clear and moist.  Eyes: Conjunctivae and EOM are normal. Pupils are equal, round, and reactive to light. Right eye exhibits no discharge. Left eye exhibits no discharge. No scleral icterus.  Neck: Normal range of motion. Neck supple. No tracheal deviation present. No thyromegaly present.  Cardiovascular: Normal rate, regular rhythm, normal heart sounds and intact distal pulses.  Exam reveals no gallop and no friction rub.   No murmur heard. Respiratory: Effort normal and breath sounds normal. No respiratory distress. She has no wheezes. She has no rales. She exhibits no tenderness.  GI: Soft. Bowel sounds are normal. She exhibits no distension and no mass. There is no tenderness. There is no rebound and no guarding.  Genitourinary:  Breasts no masses skin changes or nipple changes bilaterally      Vulva is normal without lesions Vagina is pink moist without discharge Cervix normal in appearance and pap is done Uterus is normal size shape and contour Adnexa is negative with normal sized ovaries   Musculoskeletal: Normal range of motion. She exhibits no edema and no tenderness.  Neurological: She is alert and oriented to person, place, and time. She has normal reflexes. She displays normal reflexes. No cranial nerve deficit. She exhibits normal muscle tone. Coordination normal.   Skin: Skin is warm and dry. No rash noted. No erythema. No pallor.  Psychiatric: She has a normal mood and affect. Her behavior is normal. Judgment and thought content normal.       Medications Ordered at today's visit: Meds ordered this encounter  Medications   medroxyPROGESTERone (PROVERA) 10 MG tablet    Sig: Take 1 tablet (10 mg total) by mouth daily.    Dispense:  10 tablet    Refill:  0    Other orders placed at today's visit: Orders Placed This Encounter  Procedures   POCT urine pregnancy      Assessment:    Normal Gyn exam.    Plan:    Contraception: none. Follow up in: 3 years.     Return if symptoms worsen or fail to improve.

## 2022-12-23 LAB — CYTOLOGY - PAP
Chlamydia: NEGATIVE
Comment: NEGATIVE
Comment: NORMAL
Diagnosis: NEGATIVE
Neisseria Gonorrhea: NEGATIVE

## 2023-03-03 ENCOUNTER — Emergency Department
Admission: EM | Admit: 2023-03-03 | Discharge: 2023-03-03 | Disposition: A | Discharge status: Home / Self Care | Payer: Medicaid Other | Attending: Emergency Medicine | Admitting: Emergency Medicine

## 2023-03-03 ENCOUNTER — Emergency Department: Payer: Medicaid Other

## 2023-03-03 ENCOUNTER — Other Ambulatory Visit: Payer: Self-pay

## 2023-03-03 DIAGNOSIS — J069 Acute upper respiratory infection, unspecified: Secondary | ICD-10-CM | POA: Insufficient documentation

## 2023-03-03 DIAGNOSIS — Z1152 Encounter for screening for COVID-19: Secondary | ICD-10-CM | POA: Diagnosis not present

## 2023-03-03 DIAGNOSIS — R059 Cough, unspecified: Secondary | ICD-10-CM | POA: Diagnosis present

## 2023-03-03 LAB — RESP PANEL BY RT-PCR (RSV, FLU A&B, COVID)  RVPGX2
Influenza A by PCR: NEGATIVE
Influenza B by PCR: NEGATIVE
Resp Syncytial Virus by PCR: NEGATIVE
SARS Coronavirus 2 by RT PCR: NEGATIVE

## 2023-03-03 MED ORDER — HYDROCOD POLI-CHLORPHE POLI ER 10-8 MG/5ML PO SUER: 5.0000 mL | Freq: Two times a day (BID) | ORAL | 0 refills | Status: DC | PRN

## 2023-03-03 NOTE — ED Triage Notes (Signed)
Pt to ed from upstairs from working on the floor. Pt is caox4, in no acute distress and ambulatory in triage. Pt was tested + for STREP last week. Pt was placed on cefdinir. Pt states "they think it turned into pneumonia". Pt is coughing up thick green mucus. Pt has not had a chest xray.

## 2023-03-03 NOTE — ED Provider Notes (Signed)
[facility] Provider Note    Event Date/Time   First MD Initiated Contact with Patient 03/03/23 [phone]     (approximate)   History   Cough   HPI Ann Hopkins is a 22 y.o. female who presents with concerns for developing pneumonia.  She reports that for about a week she has had viral URI symptoms with cough and has been producing sputum when she coughs.  She was diagnosed with "possible pneumonia" by her PCP who did not do a chest x-ray to listen to her lungs and started her on cefdinir.  She is that she is not feeling any better and in fact at times the cough is worse.  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  She reports occasionally feeling some fever and chills and being fatigued with shortness of breath with exertion.     Physical Exam   Triage Vital Signs: ED Triage Vitals  Encounter Vitals Group     BP 03/03/23 0219 119/77     Systolic BP Percentile --      Diastolic BP Percentile --      Pulse Rate 03/03/23 0219 83     Resp 03/03/23 0219 14     Temp 03/03/23 0219 98.1 F (36.7 C)     Temp Source 03/03/23 0219 Oral     SpO2 03/03/23 0219 98 %     Weight 03/03/23 0221 65.8 kg (145 lb)     Height 03/03/23 0221 1.524 m (5')     Head Circumference --      Peak Flow --      Pain Score 03/03/23 0221 0     Pain Loc --      Pain Education --      Exclude from Growth Chart --     Most recent vital signs: Vitals:   03/03/23 0219  BP: 119/77  Pulse: 83  Resp: 14  Temp: 98.1 F (36.7 C)  SpO2: 98%    General: Awake, generally well-appearing, no obvious distress. CV:  Good peripheral perfusion.  Regular rate and rhythm, normal heart sounds. Resp:  Normal effort. Speaking easily and comfortably, no accessory muscle usage nor intercostal retractions.  Lungs are clear to auscultation bilaterally.  No wheezing, rales, nor rhonchi. Abd:  No distention.    ED Results /  Procedures / Treatments   Labs (all labs ordered are listed, but only abnormal results are displayed) Labs Reviewed  RESP PANEL BY RT-PCR (RSV, FLU A&B, COVID)  [id number]     RADIOLOGY I viewed and interpreted the patient's two-view chest x-ray and I see no evidence of pneumonia or pulmonary edema.  I also read the radiologist's report, which confirmed no acute findings.   PROCEDURES:  Critical Care performed: No  Procedures    IMPRESSION / MDM / ASSESSMENT AND PLAN / ED COURSE  I reviewed the triage vital signs and the nursing notes.                              Differential diagnosis includes, but is not limited to, viral infection, community-acquired pneumonia, less likely PE.  Patient's presentation is most consistent with acute complicated illness / injury requiring diagnostic workup.  Labs/studies ordered: Respiratory viral panel, two-view chest x-ray  Interventions/Medications given:  Medications - No data to display  (Note:  hospital course my include additional interventions  and/or labs/studies not listed above.)   Vital signs stable, no evidence of sepsis, chest x-ray clear, strongly suspect viral URI with cough.  I had my usual viral URI discussion with the patient and she understands.  She will complete the course of cefdinir that was previously prescribed.  I will send a prescription for cough medicine to her pharmacy.  I gave my usual and customary return precautions.      FINAL CLINICAL IMPRESSION(S) / ED DIAGNOSES   Final diagnoses:  Viral URI with cough     Rx / DC Orders   ED Discharge Orders          Ordered    chlorpheniramine-HYDROcodone (TUSSIONEX) 10-8 MG/5ML  Every 12 hours PRN        03/03/23 0400             Note:  This document was prepared using Dragon voice recognition software and may include unintentional dictation errors.   Loleta Rose, MD 03/03/23 0400

## 2023-03-03 NOTE — Discharge Instructions (Signed)

## 2023-05-29 ENCOUNTER — Ambulatory Visit: Payer: Self-pay
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
## 2023-09-03 ENCOUNTER — Other Ambulatory Visit: Payer: Self-pay

## 2023-09-03 ENCOUNTER — Ambulatory Visit
Admission: RE | Admit: 2023-09-03 | Discharge: 2023-09-03 | Disposition: A | Discharge status: Home / Self Care | Source: Ambulatory Visit

## 2023-09-03 VITALS — BP 121/80 | HR 80 | Temp 98.3°F | Resp 20

## 2023-09-03 DIAGNOSIS — T24201A Burn of second degree of unspecified site of right lower limb, except ankle and foot, initial encounter: Secondary | ICD-10-CM

## 2023-09-03 DIAGNOSIS — Z3202 Encounter for pregnancy test, result negative: Secondary | ICD-10-CM

## 2023-09-03 LAB — POCT URINE PREGNANCY: Preg Test, Ur: NEGATIVE

## 2023-09-03 MED ORDER — SILVER SULFADIAZINE 1 % EX CREA: 1.0000 | TOPICAL_CREAM | Freq: Two times a day (BID) | CUTANEOUS | 0 refills | Status: DC
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
## 2023-09-19 ENCOUNTER — Encounter (HOSPITAL_COMMUNITY): Payer: Self-pay

## 2023-09-19 ENCOUNTER — Ambulatory Visit: Payer: Self-pay

## 2023-09-19 ENCOUNTER — Emergency Department (HOSPITAL_COMMUNITY)

## 2023-09-19 ENCOUNTER — Emergency Department (HOSPITAL_COMMUNITY)
Admission: EM | Admit: 2023-09-19 | Discharge: 2023-09-19 | Discharge status: Against Medical Advice | Attending: Emergency Medicine | Admitting: Emergency Medicine

## 2023-09-19 ENCOUNTER — Other Ambulatory Visit: Payer: Self-pay

## 2023-09-19 DIAGNOSIS — Z3A01 Less than 8 weeks gestation of pregnancy: Secondary | ICD-10-CM | POA: Diagnosis not present

## 2023-09-19 DIAGNOSIS — R102 Pelvic and perineal pain: Secondary | ICD-10-CM | POA: Insufficient documentation

## 2023-09-19 DIAGNOSIS — Z5329 Procedure and treatment not carried out because of patient's decision for other reasons: Secondary | ICD-10-CM | POA: Diagnosis not present

## 2023-09-19 DIAGNOSIS — O26891 Other specified pregnancy related conditions, first trimester: Secondary | ICD-10-CM | POA: Diagnosis present

## 2023-09-19 DIAGNOSIS — Z91040 Latex allergy status: Secondary | ICD-10-CM | POA: Diagnosis not present

## 2023-09-19 LAB — POC URINE PREG, ED: Preg Test, Ur: POSITIVE — AB

## 2023-09-19 NOTE — ED Triage Notes (Signed)
 Pt reports she has been trying to get pregnant for a year and has had 2 positive pregnancy tests at home but wants to be sure before she gets excited.  Pt denies any pain, has occasional nausea.

## 2023-09-19 NOTE — ED Provider Notes (Signed)
 [place] [facility] AT [facility] Provider Note   CSN: [id number] Arrival date & time: 09/19/23  1106     History  Chief Complaint  Patient presents with   Possible Pregnancy    Ann Hopkins is a 23 y.o. female presenting for confirmation of pregnancy.  Her LMP was 07/31/23,  stating she has been trying to get pregnant for the past year.  She has some mild nausea intermittently,  no emesis, also no vaginal dc or bleeding.  She does endorse low pelvic pain,  describes a sharp stab of pain, in her right lower pelvis when she rolls onto this side, similar left lower pelvis when she rolls to the left.  Lasts for a minute then resolves.  No fevers, no dysuria, hematuria, dizziness, abd distention.    The history is provided by the patient.       Home Medications Prior to Admission medications   Medication Sig Start Date End Date Taking? Authorizing Provider  chlorpheniramine-HYDROcodone (TUSSIONEX) 10-8 MG/5ML Take 5 mLs by mouth every 12 (twelve) hours as needed for cough. 03/03/23   Gordan Huxley, MD  medroxyPROGESTERone  (PROVERA ) 10 MG tablet Take 1 tablet (10 mg total) by mouth daily. 12/19/22   Jayne Vonn DEL, MD  phentermine (ADIPEX-P) 37.5 MG tablet Take 37.5 mg by mouth daily. 08/22/23   [provider]  silver  sulfADIAZINE  (SILVADENE ) 1 % cream Apply 1 Application topically 2 (two) times daily. 09/03/23   Chandra Harlene LABOR, NP      Allergies    Latex    Review of Systems   Review of Systems  Constitutional:  Negative for fever.  HENT:  Negative for congestion and sore throat.   Eyes: Negative.   Respiratory:  Negative for chest tightness and shortness of breath.   Cardiovascular:  Negative for chest pain.  Gastrointestinal:  Positive for abdominal pain and nausea. Negative for vomiting.  Genitourinary: Negative.   Musculoskeletal:  Negative for arthralgias, joint swelling and neck pain.  Skin: Negative.  Negative for rash and wound.   Neurological:  Negative for dizziness, weakness, light-headedness, numbness and headaches.  Psychiatric/Behavioral: Negative.    All other systems reviewed and are negative.   Physical Exam Updated Vital Signs BP 128/81 (BP Location: Right Arm)   Pulse 70   Temp 98.2 F (36.8 C) (Oral)   Resp 16   Ht 5' (1.524 m)   Wt 74.4 kg   LMP 07/31/2023 (Exact Date)   SpO2 99%   BMI 32.03 kg/m  Physical Exam Vitals and nursing note reviewed.  Constitutional:      Appearance: She is well-developed.  HENT:     Head: Normocephalic and atraumatic.  Eyes:     Conjunctiva/sclera: Conjunctivae normal.  Cardiovascular:     Rate and Rhythm: Normal rate and regular rhythm.     Heart sounds: Normal heart sounds.  Pulmonary:     Effort: Pulmonary effort is normal.     Breath sounds: Normal breath sounds. No wheezing.  Abdominal:     General: Bowel sounds are normal. There is no distension.     Palpations: Abdomen is soft.     Tenderness: There is no abdominal tenderness. There is no guarding or rebound.  Musculoskeletal:        General: Normal range of motion.     Cervical back: Normal range of motion.  Skin:    General: Skin is warm and dry.  Neurological:     Mental Status: She is  alert.     ED Results / Procedures / Treatments   Labs (all labs ordered are listed, but only abnormal results are displayed) Labs Reviewed  POC URINE PREG, ED - Abnormal; Notable for the following components:      Result Value   Preg Test, Ur POSITIVE (*)    All other components within normal limits    EKG None  Radiology No results found.  Procedures Procedures    Medications Ordered in ED Medications - No data to display  ED Course/ Medical Decision Making/ A&P                                 Medical Decision Making Pt with suspected pregnancy with bilateral lower pelvic pain,  triggered by positional changes.  Could reflect msk source,  could also be associated with growing uterus,  but too early to suspect round ligament involvement,  by dates, [redacted]w[redacted]d.  Pt requested US  to rule out ectopic which is reasonable and was completed.We had discussed prenatal vitamins, pt will pick otc.   Unfortunately, pt eloped prior to result.  She is planning to establish care with [facility].   Amount and/or Complexity of Data Reviewed Radiology: ordered.           Final Clinical Impression(s) / ED Diagnoses Final diagnoses:  Less than [redacted] weeks gestation of pregnancy    Rx / DC Orders ED Discharge Orders     None         Birdena Mliss RIGGERS 09/19/23 1634    Suzette Pac, MD 09/23/23 1114

## 2023-09-22 ENCOUNTER — Other Ambulatory Visit: Payer: Self-pay | Admitting: Obstetrics & Gynecology

## 2023-09-22 ENCOUNTER — Other Ambulatory Visit

## 2023-09-22 ENCOUNTER — Ambulatory Visit: Admitting: Radiology

## 2023-09-22 DIAGNOSIS — Z3201 Encounter for pregnancy test, result positive: Secondary | ICD-10-CM

## 2023-09-22 DIAGNOSIS — O3680X0 Pregnancy with inconclusive fetal viability, not applicable or unspecified: Secondary | ICD-10-CM

## 2023-09-22 DIAGNOSIS — Z3A01 Less than 8 weeks gestation of pregnancy: Secondary | ICD-10-CM

## 2023-09-22 NOTE — Progress Notes (Addendum)
 US : GA by LMP 07-31-23 = 7+4 weeks retroverted uterus with single early IUP.  GS intact within upper mid cavity Yolk Sac = 3.6 mm,  embryonic pole not yet identified.  GS size = 12.6 cm = 6+1 weeks 10 days < dates.  Needs follow up u/s in 2 weeks to confirm viability Left ovary with focal dermoid = 21 x 16 mm - Rt ov with corpus luteal - neg adnexal regions  neg CDS - no free fluid present

## 2023-09-23 ENCOUNTER — Other Ambulatory Visit: Payer: Self-pay | Admitting: Adult Health

## 2023-09-23 ENCOUNTER — Other Ambulatory Visit: Admitting: Radiology

## 2023-09-23 DIAGNOSIS — Z3201 Encounter for pregnancy test, result positive: Secondary | ICD-10-CM

## 2023-09-23 LAB — BETA HCG QUANT (REF LAB): hCG Quant: 15155 m[IU]/mL

## 2023-09-25 ENCOUNTER — Other Ambulatory Visit: Payer: Self-pay | Admitting: Obstetrics & Gynecology

## 2023-09-25 DIAGNOSIS — O3680X0 Pregnancy with inconclusive fetal viability, not applicable or unspecified: Secondary | ICD-10-CM

## 2023-10-06 ENCOUNTER — Ambulatory Visit

## 2023-10-06 DIAGNOSIS — O3680X0 Pregnancy with inconclusive fetal viability, not applicable or unspecified: Secondary | ICD-10-CM

## 2023-10-06 DIAGNOSIS — Z3491 Encounter for supervision of normal pregnancy, unspecified, first trimester: Secondary | ICD-10-CM

## 2023-10-06 DIAGNOSIS — Z3A09 9 weeks gestation of pregnancy: Secondary | ICD-10-CM

## 2023-10-06 NOTE — Progress Notes (Signed)
 US  7+5 wks,single IUP with yolk sac,CRL 13.74 mm,FHR 1557 bpm,normal ovaries,simple right corpus luteal cyst 2.4 x 2. 5 x 2.3 cm

## 2023-10-10 ENCOUNTER — Emergency Department
Admission: EM | Admit: 2023-10-10 | Discharge: 2023-10-10 | Discharge status: Against Medical Advice | Attending: Emergency Medicine | Admitting: Emergency Medicine

## 2023-10-10 ENCOUNTER — Emergency Department

## 2023-10-10 ENCOUNTER — Other Ambulatory Visit: Payer: Self-pay

## 2023-10-10 DIAGNOSIS — M545 Low back pain, unspecified: Secondary | ICD-10-CM | POA: Insufficient documentation

## 2023-10-10 DIAGNOSIS — R1030 Lower abdominal pain, unspecified: Secondary | ICD-10-CM | POA: Insufficient documentation

## 2023-10-10 DIAGNOSIS — R102 Pelvic and perineal pain: Secondary | ICD-10-CM | POA: Diagnosis not present

## 2023-10-10 DIAGNOSIS — Z3A08 8 weeks gestation of pregnancy: Secondary | ICD-10-CM | POA: Insufficient documentation

## 2023-10-10 DIAGNOSIS — O26891 Other specified pregnancy related conditions, first trimester: Secondary | ICD-10-CM | POA: Diagnosis present

## 2023-10-10 DIAGNOSIS — Z3A09 9 weeks gestation of pregnancy: Secondary | ICD-10-CM

## 2023-10-10 LAB — CBC
HCT: 37.7 % (ref 36.0–46.0)
Hemoglobin: 13.3 g/dL (ref 12.0–15.0)
MCH: 31.2 pg (ref 26.0–34.0)
MCHC: 35.3 g/dL (ref 30.0–36.0)
MCV: 88.5 fL (ref 80.0–100.0)
Platelets: 273 10*3/uL (ref 150–400)
RBC: 4.26 MIL/uL (ref 3.87–5.11)
RDW: 11.9 % (ref 11.5–15.5)
WBC: 11.7 10*3/uL — ABNORMAL HIGH (ref 4.0–10.5)
nRBC: 0 % (ref 0.0–0.2)

## 2023-10-10 LAB — HCG, QUANTITATIVE, PREGNANCY: hCG, Beta Chain, Quant, S: 86945 m[IU]/mL — ABNORMAL HIGH (ref <5)

## 2023-10-10 NOTE — ED Provider Notes (Signed)
 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 According to the patient, she was picking outpatient and felt abdominal pain, lower back pain.  Patient is pregnant, last menstrual period February 27/2025.  Patient denies vaginal bleeding, vaginal discharge, abdominal pain described as cramping, urinary symptoms, diarrhea.  Patient consulted OB/GYN who referred the patient to ED. Patient is G2, P1      Physical Exam   Triage Vital Signs: ED Triage Vitals [10/10/23 1852]  Encounter Vitals Group     BP 124/73     Systolic BP Percentile      Diastolic BP Percentile      Pulse Rate 73     Resp 20     Temp 98.4 F (36.9 C)     Temp Source Oral     SpO2 98 %     Weight      Height      Head Circumference      Peak Flow      Pain Score 6     Pain Loc      Pain Education      Exclude from Growth Chart     Most recent vital signs: Vitals:   10/10/23 1852  BP: 124/73  Pulse: 73  Resp: 20  Temp: 98.4 F (36.9 C)  SpO2: 98%     Constitutional: Alert, NAD. Able to speak in complete sentences without cough or dyspnea  Eyes: Conjunctivae are normal.  Head: Atraumatic. Nose: No congestion/rhinnorhea. Mouth/Throat: Mucous membranes are moist.   Neck: Painless ROM. Supple. No JVD, nodes, thyromegaly  Cardiovascular:   Good peripheral circulation.RRR no murmurs, gallops, rubs  Respiratory: Normal respiratory effort.  No retractions. Clear to auscultation bilaterally without wheezing or crackles  Gastrointestinal: Skin is intact, no ecchymosis no hematomas, bowel sounds positive soft and  nontender.  McBurney point negative, Rovsing negative, rebound negative CVA bilateral negative Musculoskeletal:  no deformity Neurologic:  MAE spontaneously. No gross focal neurologic deficits are appreciated.  Skin:  Skin is warm, dry and intact. No rash noted. Psychiatric: Mood and affect are normal. Speech and behavior are normal.    ED Results / Procedures / Treatments   Labs (all labs ordered are listed, but only abnormal results are displayed) Labs Reviewed  CBC - Abnormal; Notable for the following components:      Result Value   WBC 11.7 (*)    All other components within normal limits  HCG, QUANTITATIVE, PREGNANCY - Abnormal; Notable for the following components:   hCG, Beta Chain, Quant, [place] 62,952 (*)    All other components within normal limits  POC URINE PREG, ED     EKG   RADIOLOGY I independently reviewed and interpreted imaging and agree with radiologists findings.      PROCEDURES:  Critical Care performed:   Procedures   MEDICATIONS ORDERED IN ED: Medications - No data to display Clinical Course as of 10/10/23 2225  Fri Oct 10, 2023  2031 CBC(!) White blood cells elevated 11.7 [AE]    Clinical Course User Index [AE] Awilda Lennox, PA-C    IMPRESSION /  MDM / ASSESSMENT AND PLAN / ED COURSE  I reviewed the triage vital signs and the nursing notes.  Differential diagnosis includes, but is not limited to, muscle strain, ectopic pregnancy, miscarriage, ovarian torsion  Patient's presentation is most consistent with acute complicated illness / injury requiring diagnostic workup.   Patient's diagnosis is consistent with pregnancy of 9 weeks, lower abdominal pain. I independently reviewed and interpreted imaging and agree with radiologists findings. Labs are  reassuring UTI. I did review the patient's allergies and medications.the patient left the room without informing.   FINAL CLINICAL IMPRESSION(S) / ED DIAGNOSES   Final diagnoses:  Pelvic  pain in female  [redacted] weeks gestation of pregnancy     Rx / DC Orders   ED Discharge Orders     None        Note:  This document was prepared using Dragon voice recognition software and may include unintentional dictation errors.   Awilda Lennox, PA-C 10/10/23 2225    Marylynn Soho, MD 10/10/23 [phone]

## 2023-10-10 NOTE — ED Notes (Addendum)
 Upon rounding it was noted that the room was empty. The bathrooms were checked and were empty. Other staff was asked and they stated the pt had left. The pt did not call out and say she wanted to leave AMA or express any intent in leaving prior.  Unable to assess pain or score or vitals due to same.   PA-C Evans was advised of same.

## 2023-10-10 NOTE — ED Triage Notes (Addendum)
 Pt to ED via POV from home. Pt reports was pulling a pt up and felt a sharp pain in abdomen. Pt approximately [redacted]wks pregnant. Pt is G2P1. Pt reports told by OBGYN to be evaluated. No vaginal bleeding

## 2023-10-21 ENCOUNTER — Ambulatory Visit
Admission: RE | Admit: 2023-10-21 | Discharge: 2023-10-21 | Disposition: A | Discharge status: Home / Self Care | Source: Ambulatory Visit

## 2023-10-21 ENCOUNTER — Encounter: Payer: Self-pay | Admitting: Women's Health

## 2023-10-21 VITALS — BP 127/77 | HR 74 | Temp 97.6°F | Resp 16

## 2023-10-21 DIAGNOSIS — J019 Acute sinusitis, unspecified: Secondary | ICD-10-CM

## 2023-10-21 DIAGNOSIS — B9689 Other specified bacterial agents as the cause of diseases classified elsewhere: Secondary | ICD-10-CM | POA: Diagnosis not present

## 2023-10-21 MED ORDER — AMOXICILLIN-POT CLAVULANATE 875-125 MG PO TABS: 1.0000 | ORAL_TABLET | Freq: Two times a day (BID) | ORAL | 0 refills | Status: AC

## 2023-10-21 NOTE — ED Provider Notes (Signed)
 [facility]    CSN: [id number] Arrival date & time: 10/21/23  1229      History   Chief Complaint Chief Complaint  Patient presents with   Cough    Cough ,runny nose , - Entered by patient    HPI Ann Hopkins is a 23 y.o. female.   Patient presents today with 2 to 3-week history of nasal congestion, sinus pressure, runny nose, cough, headache, and dizziness.  No fever, body aches or chills, chest pain, shortness of breath, abdominal pain, diarrhea.  Patient is currently [redacted] weeks pregnant and has been having some nausea, otherwise is eating okay.  She has an appointment with OB/GYN scheduled.  She has taken Tylenol  for the headache which helps minimally.  She does not know what she can take safely in pregnancy.    Past Medical History:  Diagnosis Date   Medical history non-contributory    Seasonal allergies     Patient Active Problem List   Diagnosis Date Noted   Normal labor 07/10/2018   Indication for care in labor and delivery, antepartum 07/03/2018   Back pain affecting pregnancy in third trimester 06/29/2018   Rh negative state in antepartum period 12/10/2017   Asymptomatic bacteriuria during pregnancy in first trimester 12/08/2017    Past Surgical History:  Procedure Laterality Date   childbirth     NO PAST SURGERIES      OB History     Gravida  2   Para  1   Term  1   Preterm      AB      Living  1      SAB      IAB      Ectopic      Multiple  0   Live Births  1            Home Medications    Prior to Admission medications   Medication Sig Start Date End Date Taking? Authorizing Provider  amoxicillin -clavulanate (AUGMENTIN ) 875-125 MG tablet Take 1 tablet by mouth 2 (two) times daily for 7 days. 10/21/23 10/28/23 Yes Wilhemena Harbour, NP  Prenatal Vit-Fe Fumarate-FA (PRENATAL VITAMINS PO) Take by mouth.   Yes [provider]    Family History Family History  Problem Relation Age of Onset   Cancer  Other    Cancer Maternal Grandfather    Diabetes Maternal Grandfather     Social History Social History   Tobacco Use   Smoking status: Never    Passive exposure: Never   Smokeless tobacco: Never  Vaping Use   Vaping status: Never Used  Substance Use Topics   Alcohol use: No   Drug use: No     Allergies   Latex   Review of Systems Review of Systems Per HPI  Physical Exam Triage Vital Signs ED Triage Vitals [10/21/23 1238]  Encounter Vitals Group     BP 127/77     Systolic BP Percentile      Diastolic BP Percentile      Pulse Rate 74     Resp 16     Temp 97.6 F (36.4 C)     Temp Source Oral     SpO2 97 %     Weight      Height      Head Circumference      Peak Flow      Pain Score 0     Pain Loc      Pain  Education      Exclude from Growth Chart    No data found.  Updated Vital Signs BP 127/77 (BP Location: Right Arm)   Pulse 74   Temp 97.6 F (36.4 C) (Oral)   Resp 16   LMP 07/31/2023 (Exact Date)   SpO2 97%   Visual Acuity Right Eye Distance:   Left Eye Distance:   Bilateral Distance:    Right Eye Near:   Left Eye Near:    Bilateral Near:     Physical Exam Vitals and nursing note reviewed.  Constitutional:      General: She is not in acute distress.    Appearance: Normal appearance. She is not ill-appearing or toxic-appearing.  HENT:     Head: Normocephalic and atraumatic.     Right Ear: Tympanic membrane, ear canal and external ear normal.     Left Ear: Tympanic membrane, ear canal and external ear normal.     Nose: Congestion present. No rhinorrhea.     Right Sinus: Frontal sinus tenderness present. No maxillary sinus tenderness.     Left Sinus: Frontal sinus tenderness present. No maxillary sinus tenderness.     Mouth/Throat:     Mouth: Mucous membranes are moist.     Pharynx: Oropharynx is clear. No oropharyngeal exudate or posterior oropharyngeal erythema.  Eyes:     General: No scleral icterus.    Extraocular Movements:  Extraocular movements intact.  Cardiovascular:     Rate and Rhythm: Normal rate and regular rhythm.  Pulmonary:     Effort: Pulmonary effort is normal. No respiratory distress.     Breath sounds: Normal breath sounds. No wheezing, rhonchi or rales.  Musculoskeletal:     Cervical back: Normal range of motion and neck supple.  Lymphadenopathy:     Cervical: No cervical adenopathy.  Skin:    General: Skin is warm and dry.     Capillary Refill: Capillary refill takes less than 2 seconds.     Coloration: Skin is not jaundiced or pale.     Findings: No erythema or rash.  Neurological:     Mental Status: She is alert and oriented to person, place, and time.  Psychiatric:        Behavior: Behavior is cooperative.      UC Treatments / Results  Labs (all labs ordered are listed, but only abnormal results are displayed) Labs Reviewed - No data to display  EKG   Radiology No results found.  Procedures Procedures (including critical care time)  Medications Ordered in UC Medications - No data to display  Initial Impression / Assessment and Plan / UC Course  I have reviewed the triage vital signs and the nursing notes.  Pertinent labs & imaging results that were available during my care of the patient were reviewed by me and considered in my medical decision making (see chart for details).   Patient is well-appearing, normotensive, afebrile, not tachycardic, not tachypneic, oxygenating well on room air.   1. Acute bacterial sinusitis Treat with Augmentin  twice daily for 7 days Other supportive care discussed including continued Tylenol , start guaifenesin , saline nasal spray, dextromethorphan as needed for cough Return and ER precautions discussed with patient  The patient was given the opportunity to ask questions.  All questions answered to their satisfaction.  The patient is in agreement to this plan.   Final Clinical Impressions(s) / UC Diagnoses   Final diagnoses:  Acute  bacterial sinusitis     Discharge Instructions  You have a sinus infection.  Take the Augmentin  twice daily for 7 days as prescribed to treat it.  Symptoms should improve over the next week to 10 days.  If you develop chest pain or shortness of breath, go to the emergency room.  Some things that can make you feel better are: - Increased rest - Increasing fluid with water/sugar free electrolytes - Acetaminophen  as needed for fever/pain - Salt water gargling, chloraseptic spray and throat lozenges - OTC guaifenesin  (Mucinex ) 600 mg twice daily for congestion - Saline sinus flushes or a neti pot - Humidifying the air  OTC Medications safe in pregnancy: - acetaminophen  - cepacol throat lozenges - Coricidin HBP: chest congestion/cough, cold/flu - alcohol free cough drops - dextromethorphan - guaifenesin  - Neti pot/saline nasal rinses - Vicks vapo rub   ED Prescriptions     Medication Sig Dispense Auth. Provider   amoxicillin -clavulanate (AUGMENTIN ) 875-125 MG tablet Take 1 tablet by mouth 2 (two) times daily for 7 days. 14 tablet Wilhemena Harbour, NP      PDMP not reviewed this encounter.   Wilhemena Harbour, NP 10/21/23 [phone]

## 2023-10-21 NOTE — ED Triage Notes (Addendum)
 Pt reports cough nasal congestion and headache x 3 weeks pt states she is pregnant and is unsure what she can take has tried tylenol  for the headache but has found no relief.

## 2023-10-21 NOTE — Discharge Instructions (Signed)
 You have a sinus infection.  Take the Augmentin  twice daily for 7 days as prescribed to treat it.  Symptoms should improve over the next week to 10 days.  If you develop chest pain or shortness of breath, go to the emergency room.  Some things that can make you feel better are: - Increased rest - Increasing fluid with water/sugar free electrolytes - Acetaminophen  as needed for fever/pain - Salt water gargling, chloraseptic spray and throat lozenges - OTC guaifenesin  (Mucinex ) 600 mg twice daily for congestion - Saline sinus flushes or a neti pot - Humidifying the air  OTC Medications safe in pregnancy: - acetaminophen  - cepacol throat lozenges - Coricidin HBP: chest congestion/cough, cold/flu - alcohol free cough drops - dextromethorphan - guaifenesin  - Neti pot/saline nasal rinses - Vicks vapo rub

## 2023-10-31 ENCOUNTER — Encounter: Payer: Self-pay | Admitting: Women's Health

## 2023-11-07 ENCOUNTER — Other Ambulatory Visit: Payer: Self-pay | Admitting: Obstetrics & Gynecology

## 2023-11-07 DIAGNOSIS — O0990 Supervision of high risk pregnancy, unspecified, unspecified trimester: Secondary | ICD-10-CM | POA: Insufficient documentation

## 2023-11-07 DIAGNOSIS — Z3682 Encounter for antenatal screening for nuchal translucency: Secondary | ICD-10-CM

## 2023-11-07 DIAGNOSIS — Z3480 Encounter for supervision of other normal pregnancy, unspecified trimester: Secondary | ICD-10-CM | POA: Insufficient documentation

## 2023-11-10 ENCOUNTER — Ambulatory Visit (INDEPENDENT_AMBULATORY_CARE_PROVIDER_SITE_OTHER): Admitting: Women's Health

## 2023-11-10 ENCOUNTER — Ambulatory Visit

## 2023-11-10 ENCOUNTER — Encounter: Admitting: *Deleted

## 2023-11-10 ENCOUNTER — Encounter: Payer: Self-pay | Admitting: Women's Health

## 2023-11-10 VITALS — BP 128/72 | HR 89 | Wt 161.2 lb

## 2023-11-10 DIAGNOSIS — Z1332 Encounter for screening for maternal depression: Secondary | ICD-10-CM

## 2023-11-10 DIAGNOSIS — D369 Benign neoplasm, unspecified site: Secondary | ICD-10-CM | POA: Insufficient documentation

## 2023-11-10 DIAGNOSIS — Z3682 Encounter for antenatal screening for nuchal translucency: Secondary | ICD-10-CM

## 2023-11-10 DIAGNOSIS — Z3481 Encounter for supervision of other normal pregnancy, first trimester: Secondary | ICD-10-CM | POA: Diagnosis not present

## 2023-11-10 DIAGNOSIS — Z3A12 12 weeks gestation of pregnancy: Secondary | ICD-10-CM

## 2023-11-10 DIAGNOSIS — N83202 Unspecified ovarian cyst, left side: Secondary | ICD-10-CM | POA: Insufficient documentation

## 2023-11-10 DIAGNOSIS — Z8759 Personal history of other complications of pregnancy, childbirth and the puerperium: Secondary | ICD-10-CM | POA: Insufficient documentation

## 2023-11-10 DIAGNOSIS — Z3480 Encounter for supervision of other normal pregnancy, unspecified trimester: Secondary | ICD-10-CM

## 2023-11-10 NOTE — Patient Instructions (Signed)
Ann Hopkins, thank you for choosing our office today! We appreciate the opportunity to meet your healthcare needs. You may receive a short survey by mail, e-mail, or through MyChart. If you are happy with your care we would appreciate if you could take just a few minutes to complete the survey questions. We read all of your comments and take your feedback very seriously. Thank you again for choosing our office.  [facility] ([address] [place], [place] [address]) Entrance C, located off of [address] Free 24/7 valet parking   Nausea & Vomiting Have saltine crackers or pretzels by your bed and eat a few bites before you raise your head out of bed in the morning Eat small frequent meals throughout the day instead of large meals Drink plenty of fluids throughout the day to stay hydrated, just don't drink a lot of fluids with your meals.  This can make your stomach fill up faster making you feel sick Do not brush your teeth right after you eat Products with real ginger are good for nausea, like ginger ale and ginger hard candy Make sure it says made with real ginger! Sucking on sour candy like lemon heads is also good for nausea If your prenatal vitamins make you nauseated, take them at night so you will sleep through the nausea Sea Bands If you feel like you need medicine for the nausea & vomiting please let us know If you are unable to keep any fluids or food down please let us know   Constipation Drink plenty of fluid, preferably water, throughout the day Eat foods high in fiber such as fruits, vegetables, and grains Exercise, such as walking, is a good way to keep your bowels regular Drink warm fluids, especially warm prune juice, or decaf coffee Eat a 1/2 cup of real oatmeal (not instant), 1/2 cup applesauce, and 1/2-1 cup warm prune juice every day If needed, you may take Colace (docusate sodium) stool softener  once or twice a day to help keep the stool soft.  If you still are having problems with constipation, you may take Miralax once daily as needed to help keep your bowels regular.   Home Blood Pressure Monitoring for Patients   Your provider has recommended that you check your blood pressure (BP) at least once a week at home. If you do not have a blood pressure cuff at home, one will be provided for you. Contact your provider if you have not received your monitor within 1 week.   Helpful Tips for Accurate Home Blood Pressure Checks  Don't smoke, exercise, or drink caffeine 30 minutes before checking your BP Use the restroom before checking your BP (a full bladder can raise your pressure) Relax in a comfortable upright chair Feet on the ground Left arm resting comfortably on a flat surface at the level of your heart Legs uncrossed Back supported Sit quietly and don't talk Place the cuff on your bare arm Adjust snuggly, so that only two fingertips can fit between your skin and the top of the cuff Check 2 readings separated by at least one minute Keep a log of your BP readings For a visual, please reference this diagram: http://ccnc.care/bpdiagram  Provider Name: [facility]     Phone: [phone]  Zone 1: ALL CLEAR  Continue to monitor your symptoms:  BP reading is less than 140 (top number) or less than 90 (bottom   number)  No right upper stomach pain No headaches or seeing spots No feeling nauseated or throwing up No swelling in face and hands  Zone 2: CAUTION Call your doctor's office for any of the following:  BP reading is greater than 140 (top number) or greater than 90 (bottom number)  Stomach pain under your ribs in the middle or right side Headaches or seeing spots Feeling nauseated or throwing up Swelling in face and hands  Zone 3: EMERGENCY  Seek immediate medical care if you have any of the following:  BP reading is greater than160 (top number) or greater than  110 (bottom number) Severe headaches not improving with Tylenol Serious difficulty catching your breath Any worsening symptoms from Zone 2    First Trimester of Pregnancy The first trimester of pregnancy is from week 1 until the end of week 12 (months 1 through 3). A week after a sperm fertilizes an egg, the egg will implant on the wall of the uterus. This embryo will begin to develop into a baby. Genes from you and your partner are forming the baby. The female genes determine whether the baby is a boy or a girl. At 6-8 weeks, the eyes and face are formed, and the heartbeat can be seen on ultrasound. At the end of 12 weeks, all the baby's organs are formed.  Now that you are pregnant, you will want to do everything you can to have a healthy baby. Two of the most important things are to get good prenatal care and to follow your health care provider's instructions. Prenatal care is all the medical care you receive before the baby's birth. This care will help prevent, find, and treat any problems during the pregnancy and childbirth. BODY CHANGES Your body goes through many changes during pregnancy. The changes vary from woman to woman.  You may gain or lose a couple of pounds at first. You may feel sick to your stomach (nauseous) and throw up (vomit). If the vomiting is uncontrollable, call your health care provider. You may tire easily. You may develop headaches that can be relieved by medicines approved by your health care provider. You may urinate more often. Painful urination may mean you have a bladder infection. You may develop heartburn as a result of your pregnancy. You may develop constipation because certain hormones are causing the muscles that push waste through your intestines to slow down. You may develop hemorrhoids or swollen, bulging veins (varicose veins). Your breasts may begin to grow larger and become tender. Your nipples may stick out more, and the tissue that surrounds them  (areola) may become darker. Your gums may bleed and may be sensitive to brushing and flossing. Dark spots or blotches (chloasma, mask of pregnancy) may develop on your face. This will likely fade after the baby is born. Your menstrual periods will stop. You may have a loss of appetite. You may develop cravings for certain kinds of food. You may have changes in your emotions from day to day, such as being excited to be pregnant or being concerned that something may go wrong with the pregnancy and baby. You may have more vivid and strange dreams. You may have changes in your hair. These can include thickening of your hair, rapid growth, and changes in texture. Some women also have hair loss during or after pregnancy, or hair that feels dry or thin. Your hair will most likely return to normal after your baby is born. WHAT TO EXPECT AT YOUR PRENATAL  VISITS During a routine prenatal visit: You will be weighed to make sure you and the baby are growing normally. Your blood pressure will be taken. Your abdomen will be measured to track your baby's growth. The fetal heartbeat will be listened to starting around week 10 or 12 of your pregnancy. Test results from any previous visits will be discussed. Your health care provider may ask you: How you are feeling. If you are feeling the baby move. If you have had any abnormal symptoms, such as leaking fluid, bleeding, severe headaches, or abdominal cramping. If you have any questions. Other tests that may be performed during your first trimester include: Blood tests to find your blood type and to check for the presence of any previous infections. They will also be used to check for low iron levels (anemia) and Rh antibodies. Later in the pregnancy, blood tests for diabetes will be done along with other tests if problems develop. Urine tests to check for infections, diabetes, or protein in the urine. An ultrasound to confirm the proper growth and development  of the baby. An amniocentesis to check for possible genetic problems. Fetal screens for spina bifida and Down syndrome. You may need other tests to make sure you and the baby are doing well. HOME CARE INSTRUCTIONS  Medicines Follow your health care provider's instructions regarding medicine use. Specific medicines may be either safe or unsafe to take during pregnancy. Take your prenatal vitamins as directed. If you develop constipation, try taking a stool softener if your health care provider approves. Diet Eat regular, well-balanced meals. Choose a variety of foods, such as meat or vegetable-based protein, fish, milk and low-fat dairy products, vegetables, fruits, and whole grain breads and cereals. Your health care provider will help you determine the amount of weight gain that is right for you. Avoid raw meat and uncooked cheese. These carry germs that can cause birth defects in the baby. Eating four or five small meals rather than three large meals a day may help relieve nausea and vomiting. If you start to feel nauseous, eating a few soda crackers can be helpful. Drinking liquids between meals instead of during meals also seems to help nausea and vomiting. If you develop constipation, eat more high-fiber foods, such as fresh vegetables or fruit and whole grains. Drink enough fluids to keep your urine clear or pale yellow. Activity and Exercise Exercise only as directed by your health care provider. Exercising will help you: Control your weight. Stay in shape. Be prepared for labor and delivery. Experiencing pain or cramping in the lower abdomen or low back is a good sign that you should stop exercising. Check with your health care provider before continuing normal exercises. Try to avoid standing for long periods of time. Move your legs often if you must stand in one place for a long time. Avoid heavy lifting. Wear low-heeled shoes, and practice good posture. You may continue to have sex  unless your health care provider directs you otherwise. Relief of Pain or Discomfort Wear a good support bra for breast tenderness.   Take warm sitz baths to soothe any pain or discomfort caused by hemorrhoids. Use hemorrhoid cream if your health care provider approves.   Rest with your legs elevated if you have leg cramps or low back pain. If you develop varicose veins in your legs, wear support hose. Elevate your feet for 15 minutes, 3-4 times a day. Limit salt in your diet. Prenatal Care Schedule your prenatal visits by the  twelfth week of pregnancy. They are usually scheduled monthly at first, then more often in the last 2 months before delivery. Write down your questions. Take them to your prenatal visits. Keep all your prenatal visits as directed by your health care provider. Safety Wear your seat belt at all times when driving. Make a list of emergency phone numbers, including numbers for family, friends, the hospital, and police and fire departments. General Tips Ask your health care provider for a referral to a local prenatal education class. Begin classes no later than at the beginning of month 6 of your pregnancy. Ask for help if you have counseling or nutritional needs during pregnancy. Your health care provider can offer advice or refer you to specialists for help with various needs. Do not use hot tubs, steam rooms, or saunas. Do not douche or use tampons or scented sanitary pads. Do not cross your legs for long periods of time. Avoid cat litter boxes and soil used by cats. These carry germs that can cause birth defects in the baby and possibly loss of the fetus by miscarriage or stillbirth. Avoid all smoking, herbs, alcohol, and medicines not prescribed by your health care provider. Chemicals in these affect the formation and growth of the baby. Schedule a dentist appointment. At home, brush your teeth with a soft toothbrush and be gentle when you floss. SEEK MEDICAL CARE IF:   You have dizziness. You have mild pelvic cramps, pelvic pressure, or nagging pain in the abdominal area. You have persistent nausea, vomiting, or diarrhea. You have a bad smelling vaginal discharge. You have pain with urination. You notice increased swelling in your face, hands, legs, or ankles. SEEK IMMEDIATE MEDICAL CARE IF:  You have a fever. You are leaking fluid from your vagina. You have spotting or bleeding from your vagina. You have severe abdominal cramping or pain. You have rapid weight gain or loss. You vomit blood or material that looks like coffee grounds. You are exposed to [place] measles and have never had them. You are exposed to fifth disease or chickenpox. You develop a severe headache. You have shortness of breath. You have any kind of trauma, such as from a fall or a car accident. Document Released: 05/14/2001 Document Revised: 10/04/2013 Document Reviewed: 03/30/2013 [facility] Patient Information 2015 [place], [place]. This information is not intended to replace advice given to you by your health care provider. Make sure you discuss any questions you have with your health care provider.

## 2023-11-10 NOTE — Progress Notes (Signed)
 US  12+5 wks,measurements c/w dates,CRL 59.91 mm,NT 2 mm,NB present,FHR 157 bpm,normal right ovary,complex echogenic mass left ovary with posterior shadowing (dermoid) 2.1 x 1.8 x 1.9 cm

## 2023-11-10 NOTE — Progress Notes (Signed)
 INITIAL OBSTETRICAL VISIT Patient name: Ann Hopkins MRN [id number]  Date of birth: 2001-03-15 Chief Complaint:   Initial Prenatal Visit  History of Present Illness:   Ann Hopkins is a 23 y.o. G46P1001 Caucasian female at [redacted]w[redacted]d by US  at 7 weeks with an Estimated Date of Delivery: 05/19/24 being seen today for her initial obstetrical visit.   Patient's last menstrual period was 07/31/2023 (exact date). Her obstetrical history is significant for term SVB x 1, 3rd degree lac and PPH (EBL documented as , however w/in 7hrs of delivery was symptomatic and hgb dropped from 12 to 8, transfused 2u PRBC), baby was 8lb6oz. .   Today she reports mild occ nausea, declines meds.  Last pap 12/19/22. Results were: NILM w/ HRHPV not done     11/10/2023    8:42 AM 08/26/2018    9:48 AM 12/08/2017    3:36 PM  Depression screen PHQ 2/9  Decreased Interest 2 0 0  Down, Depressed, Hopeless 0 0 0  PHQ - 2 Score 2 0 0  Altered sleeping 0 0 0  Tired, decreased energy 2 0 1  Change in appetite 2 0 1  Feeling bad or failure about yourself  0 0 0  Trouble concentrating 0 0 0  Moving slowly or fidgety/restless 0 0 0  Suicidal thoughts 0 0 0  PHQ-9 Score 6 0 2  Difficult doing work/chores  Not difficult at all         11/10/2023    8:43 AM  GAD 7 : Generalized Anxiety Score  Nervous, Anxious, on Edge 0  Control/stop worrying 0  Worry too much - different things 0  Trouble relaxing 0  Restless 0  Easily annoyed or irritable 0  Afraid - awful might happen 0  Total GAD 7 Score 0     Review of Systems:   Pertinent items are noted in HPI Denies cramping/contractions, leakage of fluid, vaginal bleeding, abnormal vaginal discharge w/ itching/odor/irritation, headaches, visual changes, shortness of breath, chest pain, abdominal pain, severe nausea/vomiting, or problems with urination or bowel movements unless otherwise stated above.  Pertinent History Reviewed:  Reviewed past medical,surgical,  social, obstetrical and family history.  Reviewed problem list, medications and allergies. OB History  Gravida Para Term Preterm AB Living  2 1 1   1   SAB IAB Ectopic Multiple Live Births     0 1    # Outcome Date GA Lbr Len/2nd Weight Sex Type Anes PTL Lv  2 Current           1 Term 07/11/18 [redacted]w[redacted]d 487:05 / 00:30 8 lb 6.4 oz (3.81 kg) F Vag-Spont EPI  LIV   Physical Assessment:   Vitals:   11/10/23 0906  BP: 128/72  Pulse: 89  Weight: 161 lb 3.2 oz (73.1 kg)  Body mass index is 31.48 kg/m.       Physical Examination:  General appearance - well appearing, and in no distress  Mental status - alert, oriented to person, place, and time  Psych:  She has a normal mood and affect  Skin - warm and dry, normal color, no suspicious lesions noted  Chest - effort normal, all lung fields clear to auscultation bilaterally  Heart - normal rate and regular rhythm  Abdomen - soft, nontender  Extremities:  No swelling or varicosities noted  Thin prep pap is not done   Chaperone: N/A  TODAY'S NT US  12+5 wks,measurements c/w dates,CRL 59.91 mm,NT 2 mm,NB present,FHR 157  bpm,normal right ovary,complex echogenic mass left ovary with posterior shadowing (dermoid) 2.1 x 1.8 x 1.9 cm    No results found for this or any previous visit (from the past 24 hours).  Assessment & Plan:  1) Low-Risk Pregnancy G2P1001 at [redacted]w[redacted]d with an Estimated Date of Delivery: 05/19/24   2) Initial OB visit  3) H/O 3rd degree lac> baby 8lb6oz  4) H/O PPH  5) Lt dermoid cyst> stable, discussed  Meds: No orders of the defined types were placed in this encounter.   Initial labs obtained Continue prenatal vitamins Reviewed n/v relief measures and warning s/s to report Reviewed recommended weight gain based on pre-gravid BMI Encouraged well-balanced diet Genetic & carrier screening discussed: requests Panorama and NT/IT, declines Horizon  Ultrasound discussed; fetal survey: requested CCNC completed> form faxed  if has or is planning to apply for medicaid The nature of [facility] [employer] with multiple MDs and other Advanced Practice Providers was explained to patient; also emphasized that fellows, residents, and students are part of our team. Does have home bp cuff. Office bp cuff given: no. Rx sent: n/a. Check bp weekly, let us  know if consistently >140/90.   Follow-up: Return in about 4 weeks (around 12/08/2023) for LROB, 2nd IT, CNM, in person; then 8wks from now anatomy u/s and LROB.   Orders Placed This Encounter  Procedures   GC/Chlamydia Probe Amp   Urine Culture   CBC/D/Plt+RPR+Rh+ABO+RubIgG...   HgB A1c   [facility] PRENATAL TEST   Integrated 1    Ferd Householder CNM, [facility] 11/10/2023 9:59 AM

## 2023-11-11 ENCOUNTER — Encounter: Payer: Self-pay | Admitting: Women's Health

## 2023-11-11 LAB — GC/CHLAMYDIA PROBE AMP
Chlamydia trachomatis, NAA: NEGATIVE
Neisseria Gonorrhoeae by PCR: NEGATIVE

## 2023-11-11 LAB — AB SCR+ANTIBODY ID

## 2023-11-12 ENCOUNTER — Encounter: Payer: Self-pay | Admitting: Advanced Practice Midwife

## 2023-11-12 LAB — CBC/D/PLT+RPR+RH+ABO+RUBIGG...
Basophils Absolute: 0 10*3/uL (ref 0.0–0.2)
Basos: 0 %
EOS (ABSOLUTE): 0.1 10*3/uL (ref 0.0–0.4)
Eos: 1 %
HCV Ab: NONREACTIVE
HIV Screen 4th Generation wRfx: NONREACTIVE
Hematocrit: 41.2 % (ref 34.0–46.6)
Hemoglobin: 13.2 g/dL (ref 11.1–15.9)
Hepatitis B Surface Ag: NEGATIVE
Immature Grans (Abs): 0 10*3/uL (ref 0.0–0.1)
Immature Granulocytes: 0 %
Lymphocytes Absolute: 1.7 10*3/uL (ref 0.7–3.1)
Lymphs: 20 %
MCH: 30.1 pg (ref 26.6–33.0)
MCHC: 32 g/dL (ref 31.5–35.7)
MCV: 94 fL (ref 79–97)
Monocytes Absolute: 0.5 10*3/uL (ref 0.1–0.9)
Monocytes: 6 %
Neutrophils Absolute: 6.2 10*3/uL (ref 1.4–7.0)
Neutrophils: 73 %
Platelets: 223 10*3/uL (ref 150–450)
RBC: 4.38 x10E6/uL (ref 3.77–5.28)
RDW: 12.8 % (ref 11.7–15.4)
RPR Ser Ql: NONREACTIVE
Rh Factor: NEGATIVE
Rubella Antibodies, IGG: 2.64 {index} (ref >0.99)
WBC: 8.5 10*3/uL (ref 3.4–10.8)

## 2023-11-12 LAB — URINE CULTURE

## 2023-11-12 LAB — AB SCR+ANTIBODY ID

## 2023-11-12 LAB — HCV INTERPRETATION

## 2023-11-12 LAB — HEMOGLOBIN A1C
Est. average glucose Bld gHb Est-mCnc: 100 mg/dL
Hgb A1c MFr Bld: 5.1 % (ref 4.8–5.6)

## 2023-11-13 ENCOUNTER — Encounter: Payer: Self-pay | Admitting: Women's Health

## 2023-11-14 ENCOUNTER — Encounter: Payer: Self-pay | Admitting: Obstetrics & Gynecology

## 2023-11-14 ENCOUNTER — Telehealth: Payer: Self-pay | Admitting: Women's Health

## 2023-11-14 NOTE — Telephone Encounter (Signed)
 Dr Ozan to speak with patient.

## 2023-11-14 NOTE — Telephone Encounter (Signed)
 Pt is requesting a call back from the office.

## 2023-11-16 LAB — PANORAMA PRENATAL TEST FULL PANEL:PANORAMA TEST PLUS 5 ADDITIONAL MICRODELETIONS: FETAL FRACTION: 10

## 2023-11-17 ENCOUNTER — Ambulatory Visit: Payer: Self-pay | Admitting: Women's Health

## 2023-11-17 DIAGNOSIS — Z3480 Encounter for supervision of other normal pregnancy, unspecified trimester: Secondary | ICD-10-CM

## 2023-11-17 DIAGNOSIS — O361990 Maternal care for other isoimmunization, unspecified trimester, not applicable or unspecified: Secondary | ICD-10-CM | POA: Insufficient documentation

## 2023-12-02 ENCOUNTER — Telehealth: Payer: Self-pay | Admitting: *Deleted

## 2023-12-02 NOTE — Telephone Encounter (Signed)
 Patient called with c/o lower left sided pain that started last night and has continued to this morning.  States the pain is sharp.  Informed patient pain sounded like round ligament pain which can start earlier since this is her second pregnancy.  Encouraged patient to try wearing a maternity band along with getting out of bed slowly and push fluids.  If bleeding or unusual discharge occurs, to let us  know. Pt verbalized understanding.

## 2023-12-04 ENCOUNTER — Other Ambulatory Visit: Payer: Self-pay

## 2023-12-04 ENCOUNTER — Emergency Department

## 2023-12-04 ENCOUNTER — Encounter: Payer: Self-pay | Admitting: Intensive Care

## 2023-12-04 ENCOUNTER — Emergency Department
Admission: EM | Admit: 2023-12-04 | Discharge: 2023-12-04 | Disposition: A | Discharge status: Home / Self Care | Attending: Emergency Medicine | Admitting: Emergency Medicine

## 2023-12-04 DIAGNOSIS — R1032 Left lower quadrant pain: Secondary | ICD-10-CM | POA: Insufficient documentation

## 2023-12-04 DIAGNOSIS — Z3A16 16 weeks gestation of pregnancy: Secondary | ICD-10-CM | POA: Insufficient documentation

## 2023-12-04 DIAGNOSIS — R519 Headache, unspecified: Secondary | ICD-10-CM | POA: Diagnosis not present

## 2023-12-04 DIAGNOSIS — O26892 Other specified pregnancy related conditions, second trimester: Secondary | ICD-10-CM | POA: Diagnosis present

## 2023-12-04 LAB — COMPREHENSIVE METABOLIC PANEL WITH GFR
ALT: 12 U/L (ref 0–44)
AST: 21 U/L (ref 15–41)
Albumin: 3.7 g/dL (ref 3.5–5.0)
Alkaline Phosphatase: 40 U/L (ref 38–126)
Anion gap: 11 (ref 5–15)
BUN: 7 mg/dL (ref 6–20)
CO2: 20 mmol/L — ABNORMAL LOW (ref 22–32)
Calcium: 9.2 mg/dL (ref 8.9–10.3)
Chloride: 105 mmol/L (ref 98–111)
Creatinine, Ser: 0.42 mg/dL — ABNORMAL LOW (ref 0.44–1.00)
GFR, Estimated: >60 mL/min (ref >60)
Glucose, Bld: 117 mg/dL — ABNORMAL HIGH (ref 70–99)
Potassium: 3.5 mmol/L (ref 3.5–5.1)
Sodium: 136 mmol/L (ref 135–145)
Total Bilirubin: 0.5 mg/dL (ref 0.0–1.2)
Total Protein: 7.2 g/dL (ref 6.5–8.1)

## 2023-12-04 LAB — URINALYSIS, ROUTINE W REFLEX MICROSCOPIC
Bilirubin Urine: NEGATIVE
Glucose, UA: NEGATIVE mg/dL
Hgb urine dipstick: NEGATIVE
Ketones, ur: NEGATIVE mg/dL
Leukocytes,Ua: NEGATIVE
Nitrite: NEGATIVE
Protein, ur: NEGATIVE mg/dL
Specific Gravity, Urine: 1.026 (ref 1.005–1.030)
pH: 5 (ref 5.0–8.0)

## 2023-12-04 LAB — CBC
HCT: 35.6 % — ABNORMAL LOW (ref 36.0–46.0)
Hemoglobin: 12.6 g/dL (ref 12.0–15.0)
MCH: 31.2 pg (ref 26.0–34.0)
MCHC: 35.4 g/dL (ref 30.0–36.0)
MCV: 88.1 fL (ref 80.0–100.0)
Platelets: 223 10*3/uL (ref 150–400)
RBC: 4.04 MIL/uL (ref 3.87–5.11)
RDW: 12.7 % (ref 11.5–15.5)
WBC: 9 10*3/uL (ref 4.0–10.5)
nRBC: 0 % (ref 0.0–0.2)

## 2023-12-04 LAB — POC URINE PREG, ED: Preg Test, Ur: POSITIVE — AB

## 2023-12-04 LAB — LIPASE, BLOOD: Lipase: 30 U/L (ref 11–51)

## 2023-12-04 NOTE — ED Triage Notes (Signed)
 Reports she is [redacted] weeks pregnant.  Patient c/o abdominal pain X3 days. States today her blood pressure has been reading low and feeling intermittent dizziness  Also c/o headache  Denies bleeding

## 2023-12-04 NOTE — Discharge Instructions (Signed)
 Please follow-up with your OB/GYN on Monday.  Return to the emergency department with any worsening symptoms.  You can continue taking Tylenol  as needed for pain.

## 2023-12-04 NOTE — ED Provider Notes (Signed)
 [facility] Provider Note    Event Date/Time   First MD Initiated Contact with Patient 12/04/23 1828     (approximate)   History   Abdominal Pain   HPI  Ann Hopkins is a 23 y.o. female G2 P1-0-0-1 who is [redacted] weeks pregnant who presents for evaluation of headache and abdominal pain for 3 days.  Patient reports that today while at work her blood pressure was low.  She has also had intermittent dizziness.  Pain is in the left lower quadrant.  She denies associated symptoms including nausea, vomiting, diarrhea and constipation.  No urinary symptoms or fevers.      Physical Exam   Triage Vital Signs: ED Triage Vitals [12/04/23 1823]  Encounter Vitals Group     BP 129/80     Girls Systolic BP Percentile      Girls Diastolic BP Percentile      Boys Systolic BP Percentile      Boys Diastolic BP Percentile      Pulse Rate 92     Resp 18     Temp 98.3 F (36.8 C)     Temp Source Oral     SpO2 99 %     Weight 152 lb (68.9 kg)     Height 5' (1.524 m)     Head Circumference      Peak Flow      Pain Score 7     Pain Loc      Pain Education      Exclude from Growth Chart     Most recent vital signs: Vitals:   12/04/23 1900 12/04/23 1930  BP: 116/65 116/68  Pulse:    Resp:    Temp:    SpO2:     General: Awake, no distress.  CV:  Good peripheral perfusion.  RRR. Resp:  Normal effort.  CTAB. Abd:  No distention.  Soft, tender to palpation in the left lower quadrant and across the lower abdomen.    ED Results / Procedures / Treatments   Labs (all labs ordered are listed, but only abnormal results are displayed) Labs Reviewed  COMPREHENSIVE METABOLIC PANEL WITH GFR - Abnormal; Notable for the following components:      Result Value   CO2 20 (*)    Glucose, Bld 117 (*)    Creatinine, Ser 0.42 (*)    All other components within normal limits  CBC - Abnormal; Notable for the following components:   HCT 35.6 (*)    All other components  within normal limits  URINALYSIS, ROUTINE W REFLEX MICROSCOPIC - Abnormal; Notable for the following components:   Color, Urine YELLOW (*)    APPearance HAZY (*)    All other components within normal limits  POC URINE PREG, ED - Abnormal; Notable for the following components:   Preg Test, Ur POSITIVE (*)    All other components within normal limits  LIPASE, BLOOD    RADIOLOGY  OB ultrasound obtained, interpreted the images as well as reviewed the radiologist report which shows a single viable intrauterine pregnancy measuring about 16 weeks and 3 days.  There is a dermoid cyst on the left ovary which is the same from previous imaging studies.   PROCEDURES:  Critical Care performed: No  Procedures   MEDICATIONS ORDERED IN ED: Medications - No data to display   IMPRESSION / MDM / ASSESSMENT AND PLAN / ED COURSE  I reviewed the triage vital signs and the nursing notes.  22 year old female presents for evaluation of abdominal pain in pregnancy.  Vital signs are stable, patient NAD on exam.  Differential diagnosis includes, but is not limited to, constipation, round ligament pain, biliary disease, pancreatitis, ovarian torsion, diverticulitis.  Patient's presentation is most consistent with acute complicated illness / injury requiring diagnostic workup.  CMP and CBC are unremarkable.  Lipase within normal limits.  Urinalysis is negative for any signs of infection.  Patient's pain is mostly in the LLQ so greatest suspicion for round ligament pain, constipation or ovarian torsion. Will obtain US  to r/o torsion, if normal, patient would be stable for follow up with her OB.   OB ultrasound did not show any acute abnormalities.  Patient does have a cyst in the left ovary but this is the same from previous imaging studies.  This may be the cause of her pain.  She has had pain for 3 days and the pain is throughout the lower abdomen so my suspicion for ovarian  torsion is low.  We discussed strict return precautions to the emergency department with any worsening pain.  She has follow-up scheduled with her OB provider for next Monday.     FINAL CLINICAL IMPRESSION(S) / ED DIAGNOSES   Final diagnoses:  Abdominal pain during pregnancy in second trimester     Rx / DC Orders   ED Discharge Orders     None        Note:  This document was prepared using Dragon voice recognition software and may include unintentional dictation errors.   Cleaster Tinnie LABOR, PA-C 12/04/23 2136    Viviann Pastor, MD 12/09/23 [phone]

## 2023-12-08 ENCOUNTER — Ambulatory Visit (INDEPENDENT_AMBULATORY_CARE_PROVIDER_SITE_OTHER): Admitting: Advanced Practice Midwife

## 2023-12-08 VITALS — BP 132/82 | HR 120 | Wt 159.5 lb

## 2023-12-08 DIAGNOSIS — O26892 Other specified pregnancy related conditions, second trimester: Secondary | ICD-10-CM

## 2023-12-08 DIAGNOSIS — Z6791 Unspecified blood type, Rh negative: Secondary | ICD-10-CM | POA: Diagnosis not present

## 2023-12-08 DIAGNOSIS — O219 Vomiting of pregnancy, unspecified: Secondary | ICD-10-CM | POA: Diagnosis not present

## 2023-12-08 DIAGNOSIS — O361920 Maternal care for other isoimmunization, second trimester, not applicable or unspecified: Secondary | ICD-10-CM

## 2023-12-08 DIAGNOSIS — Z3A16 16 weeks gestation of pregnancy: Secondary | ICD-10-CM

## 2023-12-08 DIAGNOSIS — O26899 Other specified pregnancy related conditions, unspecified trimester: Secondary | ICD-10-CM

## 2023-12-08 DIAGNOSIS — O360120 Maternal care for anti-D [Rh] antibodies, second trimester, not applicable or unspecified: Secondary | ICD-10-CM

## 2023-12-08 DIAGNOSIS — Z3480 Encounter for supervision of other normal pregnancy, unspecified trimester: Secondary | ICD-10-CM

## 2023-12-08 DIAGNOSIS — Z1379 Encounter for other screening for genetic and chromosomal anomalies: Secondary | ICD-10-CM

## 2023-12-08 DIAGNOSIS — Z3682 Encounter for antenatal screening for nuchal translucency: Secondary | ICD-10-CM

## 2023-12-08 MED ORDER — ONDANSETRON 4 MG PO TBDP
4.0000 mg | ORAL_TABLET | Freq: Four times a day (QID) | ORAL | 2 refills | Status: DC | PRN
Start: 2023-12-08 — End: 2024-01-02

## 2023-12-08 NOTE — Progress Notes (Signed)
 LOW-RISK PREGNANCY VISIT Patient name: Ann Hopkins MRN [id number]  Date of birth: May 20, 2001 Chief Complaint:   Routine Prenatal Visit  History of Present Illness:   Ann Hopkins is a 23 y.o. G7P1001 female at [redacted]w[redacted]d with an Estimated Date of Delivery: 05/19/24 being seen today for ongoing management of a low-risk pregnancy.  Today she reports aching pain in left lower abdomen. Contractions: Not present. Vag. Bleeding: None. Movement: Present. Denies leaking of fluid. Review of Systems:   Pertinent items are noted in HPI Denies abnormal vaginal discharge w/ itching/odor/irritation, headaches, visual changes, shortness of breath, chest pain, abdominal pain, severe nausea/vomiting, or problems with urination or bowel movements unless otherwise stated above. Pertinent History Reviewed:  Reviewed past medical,surgical, social, obstetrical and family history.  Reviewed problem list, medications and allergies. Physical Assessment:   Vitals:   12/08/23 1014  BP: 132/82  Pulse: (!) 120  Weight: 72.3 kg  Body mass index is 31.15 kg/m.        Physical Examination:   General appearance: Well appearing, and in no distress  Mental status: Alert, oriented to person, place, and time  Skin: Warm & dry  Cardiovascular: Mildly tachycardic today  Respiratory: Normal respiratory effort, no distress  Abdomen: Soft, gravid, nontender  Pelvic: Cervical exam deferred         Extremities: NA Chaperone: NA   Fetal Status: Fetal Heart Rate (bpm): 150   Movement: Present      No results found for this or any previous visit (from the past 24 hours).  Assessment & Plan:    Pregnancy: G2P1001 at [redacted]w[redacted]d 1. Supervision of other normal pregnancy, antepartum Beginning to feel fetal movement Aching pain LLQ, no urinary or vaginal symptoms. Recommended Tylenol  and heating pad on low setting. May be related to dermoid cyst of ovary seen on US .  2. [redacted] weeks gestation of pregnancy (Primary) Return in 4  weeks for anatomy US   3. Genetic screening 1st integrated screen collected 11/10/23, 2nd collected today Normal NT on 11/18/23 -INTEGRATED 2  4. Rh negative state in antepartum period Rhogam at 28 weeks  5. 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 Nausea and vomiting during pregnancy Frequent nausea in the AM, at times with vomiting - ondansetron  (ZOFRAN -ODT) 4 MG disintegrating tablet; Take 1 tablet (4 mg total) by mouth every 6 (six) hours as needed for nausea.  Dispense: 30 tablet; Refill: 2  Meds:  Meds ordered this encounter  Medications   ondansetron  (ZOFRAN -ODT) 4 MG disintegrating tablet    Sig: Take 1 tablet (4 mg total) by mouth every 6 (six) hours as needed for nausea.    Dispense:  30 tablet    Refill:  2    Supervising Provider:   JAYNE VONN DEL [[id number]]   Labs/procedures today: 2nd integrated screen  Plan:  Continue routine obstetrical care Next visit: In-person for anatomy US     Reviewed: Preterm labor symptoms and general obstetric precautions including but not limited to vaginal bleeding, contractions, leaking of fluid and fetal movement were reviewed in detail with the patient.  All questions were answered. Has home bp cuff. Check bp weekly, let us  know if >140/90.   Follow-up: No follow-ups on file.  Future Appointments  Date Time Provider Department Center  01/05/2024 10:45 AM [facility] US  [facility] None  01/05/2024  11:30 AM Newton Mering, CNM [facility]    Orders Placed This Encounter  Procedures   INTEGRATED 2   Vernell FORBES Ruddle, [facility] 12/08/2023 10:49 AM

## 2023-12-08 NOTE — Patient Instructions (Signed)
 Ann Hopkins, I greatly value your feedback.  If you receive a survey following your visit with us  today, we appreciate you taking the time to fill it out.  Thanks, Sherrell Ely, CNM     [facility] HAS MOVED!!! It is now [facility] ([address] [place], [place] [address]) Entrance located off of [address] Free 24/7 valet parking   Go to [email or web address] to register for FREE online childbirth classes    Second Trimester of Pregnancy The second trimester is from week 14 through week 27 (months 4 through 6). The second trimester is often a time when you feel your best. Your body has adjusted to being pregnant, and you begin to feel better physically. Usually, morning sickness has lessened or quit completely, you may have more energy, and you may have an increase in appetite. The second trimester is also a time when the fetus is growing rapidly. At the end of the sixth month, the fetus is about 9 inches long and weighs about 1 pounds. You will likely begin to feel the baby move (quickening) between 16 and 20 weeks of pregnancy. Body changes during your second trimester Your body continues to go through many changes during your second trimester. The changes vary from woman to woman. Your weight will continue to increase. You will notice your lower abdomen bulging out. You may begin to get stretch marks on your hips, abdomen, and breasts. You may develop headaches that can be relieved by medicines. The medicines should be approved by your health care provider. You may urinate more often because the fetus is pressing on your bladder. You may develop or continue to have heartburn as a result of your pregnancy. You may develop constipation because certain hormones are causing the muscles that push waste through your intestines to slow down. You may develop hemorrhoids or swollen, bulging veins (varicose veins). You may have back pain. This is  caused by: Weight gain. Pregnancy hormones that are relaxing the joints in your pelvis. A shift in weight and the muscles that support your balance. Your breasts will continue to grow and they will continue to become tender. Your gums may bleed and may be sensitive to brushing and flossing. Dark spots or blotches (chloasma, mask of pregnancy) may develop on your face. This will likely fade after the baby is born. A dark line from your belly button to the pubic area (linea nigra) may appear. This will likely fade after the baby is born. You may have changes in your hair. These can include thickening of your hair, rapid growth, and changes in texture. Some women also have hair loss during or after pregnancy, or hair that feels dry or thin. Your hair will most likely return to normal after your baby is born.  What to expect at prenatal visits During a routine prenatal visit: You will be weighed to make sure you and the fetus are growing normally. Your blood pressure will be taken. Your abdomen will be measured to track your baby's growth. The fetal heartbeat will be listened to. Any test results from the previous visit will be discussed.  Your health care provider may ask you: How you are feeling. If you are feeling the baby move. If you have had any abnormal symptoms, such as leaking fluid, bleeding, severe headaches, or abdominal cramping. If you are using any tobacco products, including cigarettes, chewing tobacco, and electronic cigarettes. If you have any questions.  Other tests  that may be performed during your second trimester include: Blood tests that check for: Low iron levels (anemia). High blood sugar that affects pregnant women (gestational diabetes) between 61 and 28 weeks. Rh antibodies. This is to check for a protein on red blood cells (Rh factor). Urine tests to check for infections, diabetes, or protein in the urine. An ultrasound to confirm the proper growth and  development of the baby. An amniocentesis to check for possible genetic problems. Fetal screens for spina bifida and Down syndrome. HIV (human immunodeficiency virus) testing. Routine prenatal testing includes screening for HIV, unless you choose not to have this test.  Follow these instructions at home: Medicines Follow your health care provider's instructions regarding medicine use. Specific medicines may be either safe or unsafe to take during pregnancy. Take a prenatal vitamin that contains at least 600 micrograms (mcg) of folic acid. If you develop constipation, try taking a stool softener if your health care provider approves. Eating and drinking Eat a balanced diet that includes fresh fruits and vegetables, whole grains, good sources of protein such as meat, eggs, or tofu, and low-fat dairy. Your health care provider will help you determine the amount of weight gain that is right for you. Avoid raw meat and uncooked cheese. These carry germs that can cause birth defects in the baby. If you have low calcium  intake from food, talk to your health care provider about whether you should take a daily calcium  supplement. Limit foods that are high in fat and processed sugars, such as fried and sweet foods. To prevent constipation: Drink enough fluid to keep your urine clear or pale yellow. Eat foods that are high in fiber, such as fresh fruits and vegetables, whole grains, and beans. Activity Exercise only as directed by your health care provider. Most women can continue their usual exercise routine during pregnancy. Try to exercise for 30 minutes at least 5 days a week. Stop exercising if you experience uterine contractions. Avoid heavy lifting, wear low heel shoes, and practice good posture. A sexual relationship may be continued unless your health care provider directs you otherwise. Relieving pain and discomfort Wear a good support bra to prevent discomfort from breast tenderness. Take  warm sitz baths to soothe any pain or discomfort caused by hemorrhoids. Use hemorrhoid cream if your health care provider approves. Rest with your legs elevated if you have leg cramps or low back pain. If you develop varicose veins, wear support hose. Elevate your feet for 15 minutes, 3-4 times a day. Limit salt in your diet. Prenatal Care Write down your questions. Take them to your prenatal visits. Keep all your prenatal visits as told by your health care provider. This is important. Safety Wear your seat belt at all times when driving. Make a list of emergency phone numbers, including numbers for family, friends, the hospital, and police and fire departments. General instructions Ask your health care provider for a referral to a local prenatal education class. Begin classes no later than the beginning of month 6 of your pregnancy. Ask for help if you have counseling or nutritional needs during pregnancy. Your health care provider can offer advice or refer you to specialists for help with various needs. Do not use hot tubs, steam rooms, or saunas. Do not douche or use tampons or scented sanitary pads. Do not cross your legs for long periods of time. Avoid cat litter boxes and soil used by cats. These carry germs that can cause birth defects in the baby  and possibly loss of the fetus by miscarriage or stillbirth. Avoid all smoking, herbs, alcohol, and unprescribed drugs. Chemicals in these products can affect the formation and growth of the baby. Do not use any products that contain nicotine or tobacco, such as cigarettes and e-cigarettes. If you need help quitting, ask your health care provider. Visit your dentist if you have not gone yet during your pregnancy. Use a soft toothbrush to brush your teeth and be gentle when you floss. Contact a health care provider if: You have dizziness. You have mild pelvic cramps, pelvic pressure, or nagging pain in the abdominal area. You have persistent  nausea, vomiting, or diarrhea. You have a bad smelling vaginal discharge. You have pain when you urinate. Get help right away if: You have a fever. You are leaking fluid from your vagina. You have spotting or bleeding from your vagina. You have severe abdominal cramping or pain. You have rapid weight gain or weight loss. You have shortness of breath with chest pain. You notice sudden or extreme swelling of your face, hands, ankles, feet, or legs. You have not felt your baby move in over an hour. You have severe headaches that do not go away when you take medicine. You have vision changes. Summary The second trimester is from week 14 through week 27 (months 4 through 6). It is also a time when the fetus is growing rapidly. Your body goes through many changes during pregnancy. The changes vary from woman to woman. Avoid all smoking, herbs, alcohol, and unprescribed drugs. These chemicals affect the formation and growth your baby. Do not use any tobacco products, such as cigarettes, chewing tobacco, and e-cigarettes. If you need help quitting, ask your health care provider. Contact your health care provider if you have any questions. Keep all prenatal visits as told by your health care provider. This is important. This information is not intended to replace advice given to you by your health care provider. Make sure you discuss any questions you have with your health care provider.

## 2023-12-09 LAB — INTEGRATED 1
Crown Rump Length: 59.9 mm
Gest. Age on Collection Date: 12.3 wk
Maternal Age at EDD: 24.3 a
Nuchal Translucency (NT): 2 mm
Number of Fetuses: 1
PAPP-A Value: 740.3 ng/mL
Sonographer ID#: 309760
Weight: 161 [lb_av]

## 2023-12-10 LAB — INTEGRATED 2
AFP MoM: 0.78
Alpha-Fetoprotein: 24.2 ng/mL
Crown Rump Length: 59.9 mm
DIA MoM: 1.14
DIA Value: 173.9 pg/mL
Estriol, Unconjugated: 0.85 ng/mL
Gest. Age on Collection Date: 12.3 wk
Gestational Age: 16.3 wk
Maternal Age at EDD: 24.3 a
Nuchal Translucency (NT): 2 mm
Nuchal Translucency MoM: 1.26
Number of Fetuses: 1
PAPP-A MoM: 1.07
PAPP-A Value: 740.3 ng/mL
Sonographer ID#: 309760
Test Results:: NEGATIVE
Weight: 161 [lb_av]
Weight: 161 [lb_av]
hCG MoM: 0.44
hCG Value: 14.7 [IU]/mL
uE3 MoM: 0.79

## 2023-12-12 ENCOUNTER — Encounter: Payer: Self-pay | Admitting: Advanced Practice Midwife

## 2024-01-02 ENCOUNTER — Other Ambulatory Visit: Payer: Self-pay | Admitting: Obstetrics & Gynecology

## 2024-01-02 ENCOUNTER — Encounter: Payer: Self-pay | Admitting: Advanced Practice Midwife

## 2024-01-02 DIAGNOSIS — Z363 Encounter for antenatal screening for malformations: Secondary | ICD-10-CM

## 2024-01-02 DIAGNOSIS — O219 Vomiting of pregnancy, unspecified: Secondary | ICD-10-CM

## 2024-01-02 MED ORDER — ONDANSETRON 4 MG PO TBDP: 4.0000 mg | ORAL_TABLET | Freq: Four times a day (QID) | ORAL | 2 refills | Status: DC | PRN

## 2024-01-02 NOTE — Progress Notes (Signed)
 Rx for zofran   Delon Prude, DO Attending Obstetrician & Gynecologist, [facility] for [facility], [facility]

## 2024-01-05 ENCOUNTER — Ambulatory Visit (INDEPENDENT_AMBULATORY_CARE_PROVIDER_SITE_OTHER): Admitting: Advanced Practice Midwife

## 2024-01-05 ENCOUNTER — Ambulatory Visit

## 2024-01-05 VITALS — BP 114/64 | HR 97 | Wt 163.0 lb

## 2024-01-05 DIAGNOSIS — D369 Benign neoplasm, unspecified site: Secondary | ICD-10-CM | POA: Diagnosis not present

## 2024-01-05 DIAGNOSIS — Z363 Encounter for antenatal screening for malformations: Secondary | ICD-10-CM

## 2024-01-05 DIAGNOSIS — Z3A20 20 weeks gestation of pregnancy: Secondary | ICD-10-CM

## 2024-01-05 DIAGNOSIS — Z3480 Encounter for supervision of other normal pregnancy, unspecified trimester: Secondary | ICD-10-CM

## 2024-01-05 DIAGNOSIS — O361920 Maternal care for other isoimmunization, second trimester, not applicable or unspecified: Secondary | ICD-10-CM | POA: Diagnosis not present

## 2024-01-05 NOTE — Progress Notes (Signed)
   LOW-RISK PREGNANCY VISIT Patient name: Ann Hopkins MRN [id number]  Date of birth: Nov 06, 2000 Chief Complaint:   Routine Prenatal Visit  History of Present Illness:   Ann Hopkins is a 23 y.o. G61P1001 female at [redacted]w[redacted]d with an Estimated Date of Delivery: 05/19/24 being seen today for ongoing management of a low-risk pregnancy.  Today she reports . Contractions: Irritability. Vag. Bleeding: None.  Movement: Present. denies leaking of fluid. Review of Systems:   Pertinent items are noted in HPI Denies abnormal vaginal discharge w/ itching/odor/irritation, headaches, visual changes, shortness of breath, chest pain, abdominal pain, severe nausea/vomiting, or problems with urination or bowel movements unless otherwise stated above. Pertinent History Reviewed:  Reviewed past medical,surgical, social, obstetrical and family history.  Reviewed problem list, medications and allergies. Physical Assessment:   Vitals:   01/05/24 1129  BP: 114/64  Pulse: 97  Weight: 163 lb (73.9 kg)  Body mass index is 31.83 kg/m.        Physical Examination:   General appearance: Well appearing, and in no distress  Mental status: Alert, oriented to person, place, and time  Skin: Warm & dry  Cardiovascular: Normal heart rate noted  Respiratory: Normal respiratory effort, no distress  Abdomen: Soft, gravid, nontender  Pelvic: Cervical exam deferred         Extremities:   Chaperone:  N/A   Fetal Status:     Movement: Present  US  20+5 wks,cephalic,CX 4.4 cm,anterior placenta gr 0,right ovary not visualized,solid  echogenic mass left ovary (dermoid) 2 X 1.5 X 1.8 cm N/C,FHR 143 bpm,EFW 380 g 50%,anatomy complete     No results found for this or any previous visit (from the past 24 hours).  Assessment & Plan:    Pregnancy: G2P1001 at [redacted]w[redacted]d There are no diagnoses linked to this encounter.    Meds: No orders of the defined types were placed in this encounter.  Labs/procedures today: anatomy  scan  Plan:  Continue routine obstetrical care  Next visit: prefers in person    Reviewed:  obstetric precautions including but not limited to vaginal bleeding, contractions, leaking of fluid and fetal movement were reviewed in detail with the patient.  All questions were answered. Has home bp cuff. . Check bp weekly, let us  know if >140/90.   Follow-up: Return in about 4 weeks (around 02/02/2024) for LROB.  No future appointments.  No orders of the defined types were placed in this encounter.  Cathlean Ely DNP, CNM 01/05/2024 11:52 AM

## 2024-01-05 NOTE — Progress Notes (Addendum)
 US  20+5 wks,cephalic,CX 4.4 cm,anterior placenta gr 0,right ovary not visualized,solid  echogenic mass left ovary (dermoid) 2 X 1.5 X 1.8 cm N/C,FHR 143 bpm,EFW 380 g 50%,anatomy complete

## 2024-01-05 NOTE — Patient Instructions (Signed)
 Ann Hopkins, I greatly value your feedback.  If you receive a survey following your visit with us  today, we appreciate you taking the time to fill it out.  Thanks, Sherrell Ely, CNM     [facility] HAS MOVED!!! It is now [facility] ([address] [place], [place] [address]) Entrance located off of [address] Free 24/7 valet parking   Go to [email or web address] to register for FREE online childbirth classes    Second Trimester of Pregnancy The second trimester is from week 14 through week 27 (months 4 through 6). The second trimester is often a time when you feel your best. Your body has adjusted to being pregnant, and you begin to feel better physically. Usually, morning sickness has lessened or quit completely, you may have more energy, and you may have an increase in appetite. The second trimester is also a time when the fetus is growing rapidly. At the end of the sixth month, the fetus is about 9 inches long and weighs about 1 pounds. You will likely begin to feel the baby move (quickening) between 16 and 20 weeks of pregnancy. Body changes during your second trimester Your body continues to go through many changes during your second trimester. The changes vary from woman to woman. Your weight will continue to increase. You will notice your lower abdomen bulging out. You may begin to get stretch marks on your hips, abdomen, and breasts. You may develop headaches that can be relieved by medicines. The medicines should be approved by your health care provider. You may urinate more often because the fetus is pressing on your bladder. You may develop or continue to have heartburn as a result of your pregnancy. You may develop constipation because certain hormones are causing the muscles that push waste through your intestines to slow down. You may develop hemorrhoids or swollen, bulging veins (varicose veins). You may have back pain. This is  caused by: Weight gain. Pregnancy hormones that are relaxing the joints in your pelvis. A shift in weight and the muscles that support your balance. Your breasts will continue to grow and they will continue to become tender. Your gums may bleed and may be sensitive to brushing and flossing. Dark spots or blotches (chloasma, mask of pregnancy) may develop on your face. This will likely fade after the baby is born. A dark line from your belly button to the pubic area (linea nigra) may appear. This will likely fade after the baby is born. You may have changes in your hair. These can include thickening of your hair, rapid growth, and changes in texture. Some women also have hair loss during or after pregnancy, or hair that feels dry or thin. Your hair will most likely return to normal after your baby is born.  What to expect at prenatal visits During a routine prenatal visit: You will be weighed to make sure you and the fetus are growing normally. Your blood pressure will be taken. Your abdomen will be measured to track your baby's growth. The fetal heartbeat will be listened to. Any test results from the previous visit will be discussed.  Your health care provider may ask you: How you are feeling. If you are feeling the baby move. If you have had any abnormal symptoms, such as leaking fluid, bleeding, severe headaches, or abdominal cramping. If you are using any tobacco products, including cigarettes, chewing tobacco, and electronic cigarettes. If you have any questions.  Other tests  that may be performed during your second trimester include: Blood tests that check for: Low iron levels (anemia). High blood sugar that affects pregnant women (gestational diabetes) between 61 and 28 weeks. Rh antibodies. This is to check for a protein on red blood cells (Rh factor). Urine tests to check for infections, diabetes, or protein in the urine. An ultrasound to confirm the proper growth and  development of the baby. An amniocentesis to check for possible genetic problems. Fetal screens for spina bifida and Down syndrome. HIV (human immunodeficiency virus) testing. Routine prenatal testing includes screening for HIV, unless you choose not to have this test.  Follow these instructions at home: Medicines Follow your health care provider's instructions regarding medicine use. Specific medicines may be either safe or unsafe to take during pregnancy. Take a prenatal vitamin that contains at least 600 micrograms (mcg) of folic acid. If you develop constipation, try taking a stool softener if your health care provider approves. Eating and drinking Eat a balanced diet that includes fresh fruits and vegetables, whole grains, good sources of protein such as meat, eggs, or tofu, and low-fat dairy. Your health care provider will help you determine the amount of weight gain that is right for you. Avoid raw meat and uncooked cheese. These carry germs that can cause birth defects in the baby. If you have low calcium  intake from food, talk to your health care provider about whether you should take a daily calcium  supplement. Limit foods that are high in fat and processed sugars, such as fried and sweet foods. To prevent constipation: Drink enough fluid to keep your urine clear or pale yellow. Eat foods that are high in fiber, such as fresh fruits and vegetables, whole grains, and beans. Activity Exercise only as directed by your health care provider. Most women can continue their usual exercise routine during pregnancy. Try to exercise for 30 minutes at least 5 days a week. Stop exercising if you experience uterine contractions. Avoid heavy lifting, wear low heel shoes, and practice good posture. A sexual relationship may be continued unless your health care provider directs you otherwise. Relieving pain and discomfort Wear a good support bra to prevent discomfort from breast tenderness. Take  warm sitz baths to soothe any pain or discomfort caused by hemorrhoids. Use hemorrhoid cream if your health care provider approves. Rest with your legs elevated if you have leg cramps or low back pain. If you develop varicose veins, wear support hose. Elevate your feet for 15 minutes, 3-4 times a day. Limit salt in your diet. Prenatal Care Write down your questions. Take them to your prenatal visits. Keep all your prenatal visits as told by your health care provider. This is important. Safety Wear your seat belt at all times when driving. Make a list of emergency phone numbers, including numbers for family, friends, the hospital, and police and fire departments. General instructions Ask your health care provider for a referral to a local prenatal education class. Begin classes no later than the beginning of month 6 of your pregnancy. Ask for help if you have counseling or nutritional needs during pregnancy. Your health care provider can offer advice or refer you to specialists for help with various needs. Do not use hot tubs, steam rooms, or saunas. Do not douche or use tampons or scented sanitary pads. Do not cross your legs for long periods of time. Avoid cat litter boxes and soil used by cats. These carry germs that can cause birth defects in the baby  and possibly loss of the fetus by miscarriage or stillbirth. Avoid all smoking, herbs, alcohol, and unprescribed drugs. Chemicals in these products can affect the formation and growth of the baby. Do not use any products that contain nicotine or tobacco, such as cigarettes and e-cigarettes. If you need help quitting, ask your health care provider. Visit your dentist if you have not gone yet during your pregnancy. Use a soft toothbrush to brush your teeth and be gentle when you floss. Contact a health care provider if: You have dizziness. You have mild pelvic cramps, pelvic pressure, or nagging pain in the abdominal area. You have persistent  nausea, vomiting, or diarrhea. You have a bad smelling vaginal discharge. You have pain when you urinate. Get help right away if: You have a fever. You are leaking fluid from your vagina. You have spotting or bleeding from your vagina. You have severe abdominal cramping or pain. You have rapid weight gain or weight loss. You have shortness of breath with chest pain. You notice sudden or extreme swelling of your face, hands, ankles, feet, or legs. You have not felt your baby move in over an hour. You have severe headaches that do not go away when you take medicine. You have vision changes. Summary The second trimester is from week 14 through week 27 (months 4 through 6). It is also a time when the fetus is growing rapidly. Your body goes through many changes during pregnancy. The changes vary from woman to woman. Avoid all smoking, herbs, alcohol, and unprescribed drugs. These chemicals affect the formation and growth your baby. Do not use any tobacco products, such as cigarettes, chewing tobacco, and e-cigarettes. If you need help quitting, ask your health care provider. Contact your health care provider if you have any questions. Keep all prenatal visits as told by your health care provider. This is important. This information is not intended to replace advice given to you by your health care provider. Make sure you discuss any questions you have with your health care provider.

## 2024-01-07 ENCOUNTER — Encounter: Payer: Self-pay | Admitting: Advanced Practice Midwife

## 2024-01-07 LAB — AB SCR+ANTIBODY ID: Antibody Screen: POSITIVE — AB

## 2024-01-07 LAB — ANTIBODY SCREEN

## 2024-01-27 ENCOUNTER — Ambulatory Visit: Payer: Self-pay

## 2024-02-03 ENCOUNTER — Encounter: Payer: Self-pay | Admitting: Women's Health

## 2024-02-03 ENCOUNTER — Ambulatory Visit (INDEPENDENT_AMBULATORY_CARE_PROVIDER_SITE_OTHER): Admitting: Women's Health

## 2024-02-03 VITALS — BP 133/78 | HR 85 | Wt 171.0 lb

## 2024-02-03 DIAGNOSIS — Z3A24 24 weeks gestation of pregnancy: Secondary | ICD-10-CM

## 2024-02-03 DIAGNOSIS — Z3480 Encounter for supervision of other normal pregnancy, unspecified trimester: Secondary | ICD-10-CM

## 2024-02-03 DIAGNOSIS — Z3482 Encounter for supervision of other normal pregnancy, second trimester: Secondary | ICD-10-CM | POA: Diagnosis not present

## 2024-02-03 NOTE — Patient Instructions (Signed)
 Ann Hopkins, thank you for choosing our office today! We appreciate the opportunity to meet your healthcare needs. You may receive a short survey by mail, e-mail, or through [employer]. If you are happy with your care we would appreciate if you could take just a few minutes to complete the survey questions. We read all of your comments and take your feedback very seriously. Thank you again for choosing our office.  Center for [employer] Team at [facility]  [facility] at [facility] ([address] [place], [place] [address]) Entrance C, located off of [address] Free 24/7 valet parking   You will have your sugar test next visit.  Please do not eat or drink anything after midnight the night before you come, not even water.  You will be here for at least two hours.  Please make an appointment online for the bloodwork at [email or web address] for 8:00am (or as close to this as possible). Make sure you select the [facility] service center.   CLASSES: Go to [email or web address] to register for classes (childbirth, breastfeeding, waterbirth, infant CPR, daddy bootcamp, etc.)  Call the office [phone]) or go to [facility] if: You begin to have strong, frequent contractions Your water breaks.  Sometimes it is a big gush of fluid, sometimes it is just a trickle that keeps getting your panties wet or running down your legs You have vaginal bleeding.  It is normal to have a small amount of spotting if your cervix was checked.  You don't feel your baby moving like normal.  If you don't, get you something to eat and drink and lay down and focus on feeling your baby move.   If your baby is still not moving like normal, you should call the office or go to [facility].  Call the office [phone]) or go to [facility] hospital for these signs of pre-eclampsia: Severe headache that does not go away with Tylenol  Visual changes- seeing spots, double, blurred vision Pain under your right breast or upper  abdomen that does not go away with Tums or heartburn medicine Nausea and/or vomiting Severe swelling in your hands, feet, and face    [place] [facility]/Family Doctors [facility]): [address] Luba BROCKS, [phone]           [facility]: [address], [phone]                [facility]): [address], [phone]  [facility] Department: [address], [place], [phone]    [facility] Pediatricians/Family Doctors [facility]): [address], [phone] [facility]: [address] [place], [phone] [facility]: [address], [phone]  [facility] Doctors  [facility]): [phone] [facility]: [address] [phone]   [facility] Doctors [facility]: [address] [phone]  [facility] Doctors  [facility]: [phone], [phone]  Home Blood Pressure Monitoring for Patients   Your provider has recommended that you check your blood pressure (BP) at least once a week at home. If you do not have a blood pressure cuff at home, one will be provided for you. Contact your provider if you have not received your monitor within 1 week.   Helpful Tips for Accurate Home Blood Pressure Checks  Don't smoke, exercise, or drink caffeine 30 minutes before checking  your BP Use the restroom before checking your BP (a full bladder can raise your pressure) Relax in a comfortable upright chair Feet on the ground Left arm resting comfortably on a flat surface at the level of your heart Legs uncrossed Back supported Sit quietly and don't talk Place the cuff on your bare arm Adjust snuggly, so that only two fingertips can fit between your skin and the top of the cuff Check 2 readings separated by at least one  minute Keep a log of your BP readings For a visual, please reference this diagram: http://ccnc.care/bpdiagram  Provider Name: [facility]     Phone: [phone]  Zone 1: ALL CLEAR  Continue to monitor your symptoms:  BP reading is less than 140 (top number) or less than 90 (bottom number)  No right upper stomach pain No headaches or seeing spots No feeling nauseated or throwing up No swelling in face and hands  Zone 2: CAUTION Call your doctor's office for any of the following:  BP reading is greater than 140 (top number) or greater than 90 (bottom number)  Stomach pain under your ribs in the middle or right side Headaches or seeing spots Feeling nauseated or throwing up Swelling in face and hands  Zone 3: EMERGENCY  Seek immediate medical care if you have any of the following:  BP reading is greater than160 (top number) or greater than 110 (bottom number) Severe headaches not improving with Tylenol  Serious difficulty catching your breath Any worsening symptoms from Zone 2   Second Trimester of Pregnancy The second trimester is from week 13 through week 28, months 4 through 6. The second trimester is often a time when you feel your best. Your body has also adjusted to being pregnant, and you begin to feel better physically. Usually, morning sickness has lessened or quit completely, you may have more energy, and you may have an increase in appetite. The second trimester is also a time when the fetus is growing rapidly. At the end of the sixth month, the fetus is about 9 inches long and weighs about 1 pounds. You will likely begin to feel the baby move (quickening) between 18 and 20 weeks of the pregnancy. BODY CHANGES Your body goes through many changes during pregnancy. The changes vary from woman to woman.  Your weight will continue to increase. You will notice your lower abdomen bulging out. You may begin to get stretch marks on your hips, abdomen, and breasts. You may  develop headaches that can be relieved by medicines approved by your health care provider. You may urinate more often because the fetus is pressing on your bladder. You may develop or continue to have heartburn as a result of your pregnancy. You may develop constipation because certain hormones are causing the muscles that push waste through your intestines to slow down. You may develop hemorrhoids or swollen, bulging veins (varicose veins). You may have back pain because of the weight gain and pregnancy hormones relaxing your joints between the bones in your pelvis and as a result of a shift in weight and the muscles that support your balance. Your breasts will continue to grow and be tender. Your gums may bleed and may be sensitive to brushing and flossing. Dark spots or blotches (chloasma, mask of pregnancy) may develop on your face. This will likely fade after the baby is born. A dark line from your belly button to the pubic area (linea nigra) may appear. This will likely fade after the  baby is born. You may have changes in your hair. These can include thickening of your hair, rapid growth, and changes in texture. Some women also have hair loss during or after pregnancy, or hair that feels dry or thin. Your hair will most likely return to normal after your baby is born. WHAT TO EXPECT AT YOUR PRENATAL VISITS During a routine prenatal visit: You will be weighed to make sure you and the fetus are growing normally. Your blood pressure will be taken. Your abdomen will be measured to track your baby's growth. The fetal heartbeat will be listened to. Any test results from the previous visit will be discussed. Your health care provider may ask you: How you are feeling. If you are feeling the baby move. If you have had any abnormal symptoms, such as leaking fluid, bleeding, severe headaches, or abdominal cramping. If you have any questions. Other tests that may be performed during your second  trimester include: Blood tests that check for: Low iron levels (anemia). Gestational diabetes (between 24 and 28 weeks). Rh antibodies. Urine tests to check for infections, diabetes, or protein in the urine. An ultrasound to confirm the proper growth and development of the baby. An amniocentesis to check for possible genetic problems. Fetal screens for spina bifida and Down syndrome. HOME CARE INSTRUCTIONS  Avoid all smoking, herbs, alcohol, and unprescribed drugs. These chemicals affect the formation and growth of the baby. Follow your health care provider's instructions regarding medicine use. There are medicines that are either safe or unsafe to take during pregnancy. Exercise only as directed by your health care provider. Experiencing uterine cramps is a good sign to stop exercising. Continue to eat regular, healthy meals. Wear a good support bra for breast tenderness. Do not use hot tubs, steam rooms, or saunas. Wear your seat belt at all times when driving. Avoid raw meat, uncooked cheese, cat litter boxes, and soil used by cats. These carry germs that can cause birth defects in the baby. Take your prenatal vitamins. Try taking a stool softener (if your health care provider approves) if you develop constipation. Eat more high-fiber foods, such as fresh vegetables or fruit and whole grains. Drink plenty of fluids to keep your urine clear or pale yellow. Take warm sitz baths to soothe any pain or discomfort caused by hemorrhoids. Use hemorrhoid cream if your health care provider approves. If you develop varicose veins, wear support hose. Elevate your feet for 15 minutes, 3-4 times a day. Limit salt in your diet. Avoid heavy lifting, wear low heel shoes, and practice good posture. Rest with your legs elevated if you have leg cramps or low back pain. Visit your dentist if you have not gone yet during your pregnancy. Use a soft toothbrush to brush your teeth and be gentle when you floss. A  sexual relationship may be continued unless your health care provider directs you otherwise. Continue to go to all your prenatal visits as directed by your health care provider. SEEK MEDICAL CARE IF:  You have dizziness. You have mild pelvic cramps, pelvic pressure, or nagging pain in the abdominal area. You have persistent nausea, vomiting, or diarrhea. You have a bad smelling vaginal discharge. You have pain with urination. SEEK IMMEDIATE MEDICAL CARE IF:  You have a fever. You are leaking fluid from your vagina. You have spotting or bleeding from your vagina. You have severe abdominal cramping or pain. You have rapid weight gain or loss. You have shortness of breath with chest pain. You  notice sudden or extreme swelling of your face, hands, ankles, feet, or legs. You have not felt your baby move in over an hour. You have severe headaches that do not go away with medicine. You have vision changes. Document Released: 05/14/2001 Document Revised: 05/25/2013 Document Reviewed: 07/21/2012 [facility] Patient Information 2015 [place], [place]. This information is not intended to replace advice given to you by your health care provider. Make sure you discuss any questions you have with your health care provider.

## 2024-02-03 NOTE — Progress Notes (Signed)
 LOW-RISK PREGNANCY VISIT Patient name: Ann Hopkins MRN [id number]  Date of birth: 16-Feb-2001 Chief Complaint:   Routine Prenatal Visit  History of Present Illness:   Ann Hopkins is a 23 y.o. G22P1001 female at [redacted]w[redacted]d with an Estimated Date of Delivery: 05/19/24 being seen today for ongoing management of a low-risk pregnancy.   Today she reports no complaints. Contractions: Not present. Vag. Bleeding: None.  Movement: Present. denies leaking of fluid.     11/10/2023    8:42 AM 08/26/2018    9:48 AM 12/08/2017    3:36 PM  Depression screen PHQ 2/9  Decreased Interest 2 0 0  Down, Depressed, Hopeless 0 0 0  PHQ - 2 Score 2 0 0  Altered sleeping 0 0 0  Tired, decreased energy 2 0 1  Change in appetite 2 0 1  Feeling bad or failure about yourself  0 0 0  Trouble concentrating 0 0 0  Moving slowly or fidgety/restless 0 0 0  Suicidal thoughts 0 0 0  PHQ-9 Score 6 0 2  Difficult doing work/chores  Not difficult at all         11/10/2023    8:43 AM  GAD 7 : Generalized Anxiety Score  Nervous, Anxious, on Edge 0  Control/stop worrying 0  Worry too much - different things 0  Trouble relaxing 0  Restless 0  Easily annoyed or irritable 0  Afraid - awful might happen 0  Total GAD 7 Score 0      Review of Systems:   Pertinent items are noted in HPI Denies abnormal vaginal discharge w/ itching/odor/irritation, headaches, visual changes, shortness of breath, chest pain, abdominal pain, severe nausea/vomiting, or problems with urination or bowel movements unless otherwise stated above. Pertinent History Reviewed:  Reviewed past medical,surgical, social, obstetrical and family history.  Reviewed problem list, medications and allergies. Physical Assessment:   Vitals:   02/03/24 0900  BP: 133/78  Pulse: 85  Weight: 171 lb (77.6 kg)  Body mass index is 33.4 kg/m.        Physical Examination:   General appearance: Well appearing, and in no distress  Mental status: Alert,  oriented to person, place, and time  Skin: Warm & dry  Cardiovascular: Normal heart rate noted  Respiratory: Normal respiratory effort, no distress  Abdomen: Soft, gravid, nontender  Pelvic: Cervical exam deferred         Extremities: Edema: None  Fetal Status: Fetal Heart Rate (bpm): 136 Fundal Height: 24 cm Movement: Present    Chaperone: N/A No results found for this or any previous visit (from the past 24 hours).  Assessment & Plan:  1) Low-risk pregnancy G2P1001 at [redacted]w[redacted]d with an Estimated Date of Delivery: 05/19/24   2) +Anti-Kell, twtt @ 12wk and 20wks, repeat next visit   Meds: No orders of the defined types were placed in this encounter.  Labs/procedures today: none  Plan:  Continue routine obstetrical care  Next visit: prefers will be in person for pn2    Reviewed: Preterm labor symptoms and general obstetric precautions including but not limited to vaginal bleeding, contractions, leaking of fluid and fetal movement were reviewed in detail with the patient.  All questions were answered. Does have home bp cuff. Office bp cuff given: not applicable. Check bp weekly, let us  know if consistently >140 and/or >90.  Follow-up: Return in about 4 weeks (around 03/02/2024) for LROB, PN2, CNM, in person.  Future Appointments  Date Time Provider Department Center  03/02/2024  8:30 AM [facility]  03/02/2024  9:30 AM Kizzie Suzen SAUNDERS, CNM [facility]     No orders of the defined types were placed in this encounter.  Suzen SAUNDERS Kizzie CNM, [facility] 02/03/2024 9:12 AM

## 2024-02-16 ENCOUNTER — Observation Stay
Admission: EM | Admit: 2024-02-16 | Discharge: 2024-02-17 | Disposition: A | Discharge status: Home / Self Care | Attending: Obstetrics and Gynecology | Admitting: Obstetrics and Gynecology

## 2024-02-16 DIAGNOSIS — Z91040 Latex allergy status: Secondary | ICD-10-CM | POA: Insufficient documentation

## 2024-02-16 DIAGNOSIS — Z3A26 26 weeks gestation of pregnancy: Secondary | ICD-10-CM | POA: Insufficient documentation

## 2024-02-16 DIAGNOSIS — O4192X0 Disorder of amniotic fluid and membranes, unspecified, second trimester, not applicable or unspecified: Secondary | ICD-10-CM | POA: Diagnosis present

## 2024-02-16 DIAGNOSIS — O4290 Premature rupture of membranes, unspecified as to length of time between rupture and onset of labor, unspecified weeks of gestation: Principal | ICD-10-CM | POA: Diagnosis present

## 2024-02-16 LAB — RUPTURE OF MEMBRANE (ROM)PLUS: Rom Plus: NEGATIVE

## 2024-02-16 MED ORDER — LACTATED RINGERS IV SOLN: 125.0000 mL/h | INTRAVENOUS | Status: DC

## 2024-02-16 MED ORDER — CALCIUM CARBONATE ANTACID 500 MG PO CHEW: 2.0000 | CHEWABLE_TABLET | ORAL | Status: DC | PRN

## 2024-02-17 DIAGNOSIS — O4192X0 Disorder of amniotic fluid and membranes, unspecified, second trimester, not applicable or unspecified: Secondary | ICD-10-CM | POA: Diagnosis not present

## 2024-02-17 DIAGNOSIS — O4290 Premature rupture of membranes, unspecified as to length of time between rupture and onset of labor, unspecified weeks of gestation: Principal | ICD-10-CM | POA: Diagnosis present

## 2024-02-17 HISTORY — DX: Premature rupture of membranes, unspecified as to length of time between rupture and onset of labor, unspecified weeks of gestation: O42.90

## 2024-02-17 NOTE — Discharge Summary (Signed)
 GREIDYS DELAND is a 23 y.o. female. She is at [redacted]w[redacted]d gestation. Patient's last menstrual period was 07/31/2023 (exact date). Estimated Date of Delivery: 05/19/24  Prenatal care site: [facility]  Chief complaint: leaking of fluid  HPI: Emmalina presents to L&D with complaints of Rupture of membranes: Date/time: 02/16/24@2030  she was running with her daughter and begin leaking fluid  Factors complicating pregnancy: Low-Risk pregnancy  + anti-kell  S: Resting comfortably. no CTX, no VB.no LOF,  Active fetal movement.   Maternal Medical History:  Past Medical Hx:  has a past medical history of Medical history non-contributory and Seasonal allergies.    Past Surgical Hx:  has a past surgical history that includes No past surgeries and childbirth.   Allergies  Allergen Reactions   Latex Rash     Prior to Admission medications   Medication Sig Start Date End Date Taking? Authorizing Provider  ondansetron  (ZOFRAN -ODT) 4 MG disintegrating tablet Take 1 tablet (4 mg total) by mouth every 6 (six) hours as needed for nausea. 01/02/24   Ozan, Jennifer, DO  Prenatal Vit-Fe Fumarate-FA (PRENATAL VITAMINS PO) Take by mouth.    [provider]    Social History: She  reports that she has never smoked. She has never been exposed to tobacco smoke. She has never used smokeless tobacco. She reports that she does not drink alcohol and does not use drugs.  Family History: family history includes Cancer in her maternal grandfather and another family member; Diabetes in her maternal grandfather. ,no history of gyn cancers  Review of Systems: A full review of systems was performed and negative except as noted in the HPI.    O:  LMP 07/31/2023 (Exact Date)  Results for orders placed or performed during the hospital encounter of 02/16/24 (from the past 48 hours)  Rupture of Membrane (ROM) Plus   Collection Time: 02/16/24 11:07 PM  Result Value Ref Range   Rom Plus NEGATIVE       Constitutional: NAD, AAOx3  HE/ENT: extraocular movements grossly intact, moist mucous membranes CV: RRR PULM: nl respiratory effort, CTABL Abd: gravid, non-tender, non-distended, soft  Ext: Non-tender, Nonedmeatous Psych: mood appropriate, speech normal Pelvic : deferred SVE:     NST: leaking of fluid Baseline FHR: 140 beats/min Variability: moderate Accelerations: present Decelerations: present, small variables, but appropriate for gestational age Tocometry: quiet Time: at least 20 minutes   Interpretation: Category II INDICATIONS: leaking fluid  RESULTS:  A NST procedure was performed with FHR monitoring and a normal baseline established, appropriate time of 20-40 minutes of evaluation, and accels >2 seen w 15x15 characteristics.  Results show a REACTIVE NST.    Imaging Studies: No results found.  Assessment: 23 y.o. [redacted]w[redacted]d here for antenatal surveillance during pregnancy.  Principle diagnosis: leaking of fluid There were no encounter diagnoses.   Plan: Labor: not present.  Fetal Wellbeing: Reassuring Cat 2 tracing. Appropriate for gestational age Reactive NST  ROM plus neg D/c home stable, precautions reviewed, follow-up as scheduled.   ----- Bobbette Brunswick, CNM Certified Nurse Midwife [facility]

## 2024-02-17 NOTE — Progress Notes (Addendum)
 Patient G2P1, 26.6 weeks presents to L&D with complaints of leaking fluid. Patient started she felt gush of fluid @ 2030 while running with her daughter. Denies pain or bleeding.   No fluid seen by RN at arrival. ROM + sent down. Awaiting results.

## 2024-03-02 ENCOUNTER — Other Ambulatory Visit

## 2024-03-02 ENCOUNTER — Encounter: Payer: Self-pay | Admitting: Women's Health

## 2024-03-02 ENCOUNTER — Ambulatory Visit: Admitting: Women's Health

## 2024-03-02 VITALS — BP 117/77 | HR 75 | Wt 173.0 lb

## 2024-03-02 DIAGNOSIS — Z3480 Encounter for supervision of other normal pregnancy, unspecified trimester: Secondary | ICD-10-CM

## 2024-03-02 DIAGNOSIS — Z3A28 28 weeks gestation of pregnancy: Secondary | ICD-10-CM

## 2024-03-02 DIAGNOSIS — Z1332 Encounter for screening for maternal depression: Secondary | ICD-10-CM

## 2024-03-02 DIAGNOSIS — Z3483 Encounter for supervision of other normal pregnancy, third trimester: Secondary | ICD-10-CM | POA: Diagnosis not present

## 2024-03-02 DIAGNOSIS — Z3482 Encounter for supervision of other normal pregnancy, second trimester: Secondary | ICD-10-CM

## 2024-03-02 DIAGNOSIS — Z23 Encounter for immunization: Secondary | ICD-10-CM | POA: Diagnosis not present

## 2024-03-02 DIAGNOSIS — Z131 Encounter for screening for diabetes mellitus: Secondary | ICD-10-CM

## 2024-03-02 NOTE — Patient Instructions (Signed)
 Ann Hopkins, thank you for choosing our office today! We appreciate the opportunity to meet your healthcare needs. You may receive a short survey by mail, e-mail, or through [employer]. If you are happy with your care we would appreciate if you could take just a few minutes to complete the survey questions. We read all of your comments and take your feedback very seriously. Thank you again for choosing our office.  Center for [employer] Team at [facility]  [facility] at [facility] ([address] [place], [place] [address]) Entrance C, located off of [address] Free 24/7 valet parking   CLASSES: Go to [email or web address] to register for classes (childbirth, breastfeeding, waterbirth, infant CPR, daddy bootcamp, etc.)  Call the office [phone]) or go to [facility] if: You begin to have strong, frequent contractions Your water breaks.  Sometimes it is a big gush of fluid, sometimes it is just a trickle that keeps getting your panties wet or running down your legs You have vaginal bleeding.  It is normal to have a small amount of spotting if your cervix was checked.  You don't feel your baby moving like normal.  If you don't, get you something to eat and drink and lay down and focus on feeling your baby move.   If your baby is still not moving like normal, you should call the office or go to [facility].  Call the office [phone]) or go to [facility] hospital for these signs of pre-eclampsia: Severe headache that does not go away with Tylenol Visual changes- seeing spots, double, blurred vision Pain under your right breast or upper abdomen that does not go away with Tums or heartburn medicine Nausea and/or vomiting Severe swelling in your hands, feet, and face   Tdap Vaccine It is recommended that you get the Tdap vaccine during the third trimester of EACH pregnancy to help protect your baby from getting pertussis (whooping cough) 27-36 weeks is the BEST time to do  this so that you can pass the protection on to your baby. During pregnancy is better than after pregnancy, but if you are unable to get it during pregnancy it will be offered at the hospital.  You can get this vaccine with [place], at the health department, your family doctor, or some local pharmacies Everyone who will be around your baby should also be up-to-date on their vaccines before the baby comes. Adults (who are not pregnant) only need 1 dose of Tdap during adulthood.   [facility] Pediatricians/Family Doctors [facility]): [address] Colette Ribas, [phone]           [facility]: [address], [phone]                [facility]): [address], [phone] (call to ask if accepting patients) [facility] Department: [address], [place], [phone]    [facility] Pediatricians/Family Doctors [facility]): [phone] [address], [phone] [facility]: [address] [place], [phone] [facility]: [address], [phone]  [facility] Doctors  [facility]): [phone] [facility]: [address], [phone]   [facility] Doctors [facility]: [address], [phone]  [facility] Family Doctors  [facility]: [phone], [phone]  Home Blood Pressure Monitoring for Patients   Your provider has recommended that you check your  blood pressure (BP) at least once a week at home. If you do not have a blood pressure cuff at home, one will be provided for you. Contact your provider if you have not received your monitor within 1 week.   Helpful Tips for Accurate Home Blood Pressure Checks  Don't smoke, exercise, or drink caffeine 30 minutes before checking your BP Use the restroom before checking your BP (a full bladder can raise your  pressure) Relax in a comfortable upright chair Feet on the ground Left arm resting comfortably on a flat surface at the level of your heart Legs uncrossed Back supported Sit quietly and don't talk Place the cuff on your bare arm Adjust snuggly, so that only two fingertips can fit between your skin and the top of the cuff Check 2 readings separated by at least one minute Keep a log of your BP readings For a visual, please reference this diagram: http://ccnc.care/bpdiagram  Provider Name: [facility]     Phone: [phone]  Zone 1: ALL CLEAR  Continue to monitor your symptoms:  BP reading is less than 140 (top number) or less than 90 (bottom number)  No right upper stomach pain No headaches or seeing spots No feeling nauseated or throwing up No swelling in face and hands  Zone 2: CAUTION Call your doctor's office for any of the following:  BP reading is greater than 140 (top number) or greater than 90 (bottom number)  Stomach pain under your ribs in the middle or right side Headaches or seeing spots Feeling nauseated or throwing up Swelling in face and hands  Zone 3: EMERGENCY  Seek immediate medical care if you have any of the following:  BP reading is greater than160 (top number) or greater than 110 (bottom number) Severe headaches not improving with Tylenol Serious difficulty catching your breath Any worsening symptoms from Zone 2   Third Trimester of Pregnancy The third trimester is from week 29 through week 42, months 7 through 9. The third trimester is a time when the fetus is growing rapidly. At the end of the ninth month, the fetus is about 20 inches in length and weighs 6-10 pounds.  BODY CHANGES Your body goes through many changes during pregnancy. The changes vary from woman to woman.  Your weight will continue to increase. You can expect to gain 25-35 pounds (11-16 kg) by the end of the pregnancy. You may begin to get stretch marks on your hips, abdomen,  and breasts. You may urinate more often because the fetus is moving lower into your pelvis and pressing on your bladder. You may develop or continue to have heartburn as a result of your pregnancy. You may develop constipation because certain hormones are causing the muscles that push waste through your intestines to slow down. You may develop hemorrhoids or swollen, bulging veins (varicose veins). You may have pelvic pain because of the weight gain and pregnancy hormones relaxing your joints between the bones in your pelvis. Backaches may result from overexertion of the muscles supporting your posture. You may have changes in your hair. These can include thickening of your hair, rapid growth, and changes in texture. Some women also have hair loss during or after pregnancy, or hair that feels dry or thin. Your hair will most likely return to normal after your baby is born. Your breasts will continue to grow and be tender. A yellow discharge may leak from your breasts called colostrum. Your belly button may stick out. You may  feel short of breath because of your expanding uterus. You may notice the fetus "dropping," or moving lower in your abdomen. You may have a bloody mucus discharge. This usually occurs a few days to a week before labor begins. Your cervix becomes thin and soft (effaced) near your due date. WHAT TO EXPECT AT YOUR PRENATAL EXAMS  You will have prenatal exams every 2 weeks until week 36. Then, you will have weekly prenatal exams. During a routine prenatal visit: You will be weighed to make sure you and the fetus are growing normally. Your blood pressure is taken. Your abdomen will be measured to track your baby's growth. The fetal heartbeat will be listened to. Any test results from the previous visit will be discussed. You may have a cervical check near your due date to see if you have effaced. At around 36 weeks, your caregiver will check your cervix. At the same time, your  caregiver will also perform a test on the secretions of the vaginal tissue. This test is to determine if a type of bacteria, Group B streptococcus, is present. Your caregiver will explain this further. Your caregiver may ask you: What your birth plan is. How you are feeling. If you are feeling the baby move. If you have had any abnormal symptoms, such as leaking fluid, bleeding, severe headaches, or abdominal cramping. If you have any questions. Other tests or screenings that may be performed during your third trimester include: Blood tests that check for low iron levels (anemia). Fetal testing to check the health, activity level, and growth of the fetus. Testing is done if you have certain medical conditions or if there are problems during the pregnancy. FALSE LABOR You may feel small, irregular contractions that eventually go away. These are called Braxton Hicks contractions, or false labor. Contractions may last for hours, days, or even weeks before true labor sets in. If contractions come at regular intervals, intensify, or become painful, it is best to be seen by your caregiver.  SIGNS OF LABOR  Menstrual-like cramps. Contractions that are 5 minutes apart or less. Contractions that start on the top of the uterus and spread down to the lower abdomen and back. A sense of increased pelvic pressure or back pain. A watery or bloody mucus discharge that comes from the vagina. If you have any of these signs before the 37th week of pregnancy, call your caregiver right away. You need to go to the hospital to get checked immediately. HOME CARE INSTRUCTIONS  Avoid all smoking, herbs, alcohol, and unprescribed drugs. These chemicals affect the formation and growth of the baby. Follow your caregiver's instructions regarding medicine use. There are medicines that are either safe or unsafe to take during pregnancy. Exercise only as directed by your caregiver. Experiencing uterine cramps is a good sign to  stop exercising. Continue to eat regular, healthy meals. Wear a good support bra for breast tenderness. Do not use hot tubs, steam rooms, or saunas. Wear your seat belt at all times when driving. Avoid raw meat, uncooked cheese, cat litter boxes, and soil used by cats. These carry germs that can cause birth defects in the baby. Take your prenatal vitamins. Try taking a stool softener (if your caregiver approves) if you develop constipation. Eat more high-fiber foods, such as fresh vegetables or fruit and whole grains. Drink plenty of fluids to keep your urine clear or pale yellow. Take warm sitz baths to soothe any pain or discomfort caused by hemorrhoids. Use hemorrhoid cream if  your caregiver approves. If you develop varicose veins, wear support hose. Elevate your feet for 15 minutes, 3-4 times a day. Limit salt in your diet. Avoid heavy lifting, wear low heal shoes, and practice good posture. Rest a lot with your legs elevated if you have leg cramps or low back pain. Visit your dentist if you have not gone during your pregnancy. Use a soft toothbrush to brush your teeth and be gentle when you floss. A sexual relationship may be continued unless your caregiver directs you otherwise. Do not travel far distances unless it is absolutely necessary and only with the approval of your caregiver. Take prenatal classes to understand, practice, and ask questions about the labor and delivery. Make a trial run to the hospital. Pack your hospital bag. Prepare the baby's nursery. Continue to go to all your prenatal visits as directed by your caregiver. SEEK MEDICAL CARE IF: You are unsure if you are in labor or if your water has broken. You have dizziness. You have mild pelvic cramps, pelvic pressure, or nagging pain in your abdominal area. You have persistent nausea, vomiting, or diarrhea. You have a bad smelling vaginal discharge. You have pain with urination. SEEK IMMEDIATE MEDICAL CARE IF:  You  have a fever. You are leaking fluid from your vagina. You have spotting or bleeding from your vagina. You have severe abdominal cramping or pain. You have rapid weight loss or gain. You have shortness of breath with chest pain. You notice sudden or extreme swelling of your face, hands, ankles, feet, or legs. You have not felt your baby move in over an hour. You have severe headaches that do not go away with medicine. You have vision changes. Document Released: 05/14/2001 Document Revised: 05/25/2013 Document Reviewed: 07/21/2012 [facility] Patient Information 2015 [place], [place]. This information is not intended to replace advice given to you by your health care provider. Make sure you discuss any questions you have with your health care provider.

## 2024-03-02 NOTE — Progress Notes (Signed)
 LOW-RISK PREGNANCY VISIT Patient name: Ann Hopkins MRN [id number]  Date of birth: 2000/06/10 Chief Complaint:   Routine Prenatal Visit  History of Present Illness:   MONTOYA WATKIN is a 23 y.o. G29P1001 female at [redacted]w[redacted]d with an Estimated Date of Delivery: 05/19/24 being seen today for ongoing management of a low-risk pregnancy.   Today she reports no complaints. Contractions: Not present. Vag. Bleeding: None.  Movement: Present. denies leaking of fluid.     03/02/2024    9:30 AM 11/10/2023    8:42 AM 08/26/2018    9:48 AM 12/08/2017    3:36 PM  Depression screen PHQ 2/9  Decreased Interest 0 2 0 0  Down, Depressed, Hopeless 0 0 0 0  PHQ - 2 Score 0 2 0 0  Altered sleeping 0 0 0 0  Tired, decreased energy 0 2 0 1  Change in appetite 0 2 0 1  Feeling bad or failure about yourself  0 0 0 0  Trouble concentrating 0 0 0 0  Moving slowly or fidgety/restless 0 0 0 0  Suicidal thoughts 0 0 0 0  PHQ-9 Score 0 6 0 2  Difficult doing work/chores   Not difficult at all         03/02/2024    9:30 AM 11/10/2023    8:43 AM  GAD 7 : Generalized Anxiety Score  Nervous, Anxious, on Edge 0 0  Control/stop worrying 0 0  Worry too much - different things 0 0  Trouble relaxing 0 0  Restless 0 0  Easily annoyed or irritable 0 0  Afraid - awful might happen 0 0  Total GAD 7 Score 0 0      Review of Systems:   Pertinent items are noted in HPI Denies abnormal vaginal discharge w/ itching/odor/irritation, headaches, visual changes, shortness of breath, chest pain, abdominal pain, severe nausea/vomiting, or problems with urination or bowel movements unless otherwise stated above. Pertinent History Reviewed:  Reviewed past medical,surgical, social, obstetrical and family history.  Reviewed problem list, medications and allergies. Physical Assessment:   Vitals:   03/02/24 0923  BP: 117/77  Pulse: 75  Weight: 173 lb (78.5 kg)  Body mass index is 33.79 kg/m.        Physical Examination:    General appearance: Well appearing, and in no distress  Mental status: Alert, oriented to person, place, and time  Skin: Warm & dry  Cardiovascular: Normal heart rate noted  Respiratory: Normal respiratory effort, no distress  Abdomen: Soft, gravid, nontender  Pelvic: Cervical exam deferred         Extremities:    Fetal Status: Fetal Heart Rate (bpm): 130 Fundal Height: 28 cm Movement: Present    Chaperone: N/A No results found for this or any previous visit (from the past 24 hours).  Assessment & Plan:  1) Low-risk pregnancy G2P1001 at [redacted]w[redacted]d with an Estimated Date of Delivery: 05/19/24   2) +Anti-Kell, ttwt, repeat today   Meds: No orders of the defined types were placed in this encounter.  Labs/procedures today: flu shot, Tdap, and PN2  Plan:  Continue routine obstetrical care  Next visit: prefers will be in person for rhogam    Reviewed: Preterm labor symptoms and general obstetric precautions including but not limited to vaginal bleeding, contractions, leaking of fluid and fetal movement were reviewed in detail with the patient.  All questions were answered. Does have home bp cuff. Office bp cuff given: not applicable. Check bp weekly, let  us  know if consistently >140 and/or >90.  Follow-up: Return in about 2 weeks (around 03/16/2024) for LROB, CNM, in person.  No future appointments.  Orders Placed This Encounter  Procedures   Flu vaccine trivalent PF, 6mos and older(Flulaval,Afluria,Fluarix,Fluzone)   Tdap vaccine greater than or equal to 7yo IM   Suzen JONELLE Fetters CNM, [facility] 03/02/2024 9:50 AM

## 2024-03-03 ENCOUNTER — Other Ambulatory Visit: Payer: Self-pay

## 2024-03-03 ENCOUNTER — Telehealth: Payer: Self-pay

## 2024-03-03 ENCOUNTER — Ambulatory Visit: Payer: Self-pay | Admitting: Women's Health

## 2024-03-03 DIAGNOSIS — O24410 Gestational diabetes mellitus in pregnancy, diet controlled: Secondary | ICD-10-CM | POA: Insufficient documentation

## 2024-03-03 DIAGNOSIS — O0990 Supervision of high risk pregnancy, unspecified, unspecified trimester: Secondary | ICD-10-CM

## 2024-03-03 DIAGNOSIS — Z8632 Personal history of gestational diabetes: Secondary | ICD-10-CM | POA: Insufficient documentation

## 2024-03-03 DIAGNOSIS — O24419 Gestational diabetes mellitus in pregnancy, unspecified control: Secondary | ICD-10-CM | POA: Insufficient documentation

## 2024-03-03 DIAGNOSIS — Z3A29 29 weeks gestation of pregnancy: Secondary | ICD-10-CM

## 2024-03-03 LAB — AB SCR+ANTIBODY ID

## 2024-03-03 MED ORDER — ASPIRIN 81 MG PO TBEC: 81.0000 mg | DELAYED_RELEASE_TABLET | Freq: Every day | ORAL | 3 refills | Status: DC

## 2024-03-03 MED ORDER — ACCU-CHEK SOFTCLIX LANCETS MISC: 12 refills | Status: DC

## 2024-03-03 MED ORDER — GLUCOSE BLOOD VI STRP: ORAL_STRIP | 12 refills | Status: DC

## 2024-03-03 MED ORDER — ACCU-CHEK GUIDE ME W/DEVICE KIT: 1.0000 | PACK | Freq: Four times a day (QID) | 0 refills | Status: DC

## 2024-03-03 NOTE — Telephone Encounter (Signed)
 Rn called patient per patient request. Patient recently diagnosed with GDM and wanted to know risk to baby. RN informed patient that uncontrolled sugars could cause baby to be Large for gestational age which may cause the patient to be delivered earlier. RN also explained that the baby will have to have its sugars checked in the hospital to make sure it does not have hypoglycemia. Patient did state she has an appointment with diabetes dietician and they could answer any further questions she may have. Encourage to message us  or call office if further questions arise

## 2024-03-04 ENCOUNTER — Encounter: Payer: Self-pay | Admitting: Women's Health

## 2024-03-04 ENCOUNTER — Ambulatory Visit

## 2024-03-04 ENCOUNTER — Other Ambulatory Visit

## 2024-03-04 ENCOUNTER — Encounter: Payer: Self-pay | Admitting: Advanced Practice Midwife

## 2024-03-04 ENCOUNTER — Other Ambulatory Visit: Payer: Self-pay | Admitting: *Deleted

## 2024-03-04 ENCOUNTER — Ambulatory Visit: Admitting: Nutrition

## 2024-03-04 DIAGNOSIS — O361920 Maternal care for other isoimmunization, second trimester, not applicable or unspecified: Secondary | ICD-10-CM

## 2024-03-04 LAB — AB SCR+ANTIBODY ID
Antibody Screen: POSITIVE — AB
Coombs Titer #1: 128 — AB

## 2024-03-04 LAB — GLUCOSE TOLERANCE, 2 HOURS W/ 1HR
Glucose, 1 hour: 180 mg/dL — ABNORMAL HIGH (ref 70–179)
Glucose, 2 hour: 135 mg/dL (ref 70–152)
Glucose, Fasting: 83 mg/dL (ref 70–91)

## 2024-03-04 LAB — HIV ANTIBODY (ROUTINE TESTING W REFLEX): HIV Screen 4th Generation wRfx: NONREACTIVE

## 2024-03-04 LAB — CBC
Hematocrit: 34.7 % (ref 34.0–46.6)
Hemoglobin: 11.6 g/dL (ref 11.1–15.9)
MCH: 30.1 pg (ref 26.6–33.0)
MCHC: 33.4 g/dL (ref 31.5–35.7)
MCV: 90 fL (ref 79–97)
Platelets: 196 x10E3/uL (ref 150–450)
RBC: 3.86 x10E6/uL (ref 3.77–5.28)
RDW: 13 % (ref 11.7–15.4)
WBC: 9.4 x10E3/uL (ref 3.4–10.8)

## 2024-03-04 LAB — ANTIBODY SCREEN

## 2024-03-04 LAB — RPR: RPR Ser Ql: NONREACTIVE

## 2024-03-05 ENCOUNTER — Other Ambulatory Visit: Payer: Self-pay | Admitting: *Deleted

## 2024-03-05 ENCOUNTER — Ambulatory Visit: Attending: Obstetrics and Gynecology | Admitting: Maternal & Fetal Medicine

## 2024-03-05 ENCOUNTER — Other Ambulatory Visit: Payer: Self-pay | Admitting: Obstetrics and Gynecology

## 2024-03-05 ENCOUNTER — Ambulatory Visit (HOSPITAL_BASED_OUTPATIENT_CLINIC_OR_DEPARTMENT_OTHER)

## 2024-03-05 ENCOUNTER — Ambulatory Visit

## 2024-03-05 VITALS — BP 116/55 | HR 84

## 2024-03-05 DIAGNOSIS — Z3A29 29 weeks gestation of pregnancy: Secondary | ICD-10-CM | POA: Diagnosis not present

## 2024-03-05 DIAGNOSIS — E669 Obesity, unspecified: Secondary | ICD-10-CM

## 2024-03-05 DIAGNOSIS — O361930 Maternal care for other isoimmunization, third trimester, not applicable or unspecified: Secondary | ICD-10-CM

## 2024-03-05 DIAGNOSIS — D271 Benign neoplasm of left ovary: Secondary | ICD-10-CM | POA: Insufficient documentation

## 2024-03-05 DIAGNOSIS — O99213 Obesity complicating pregnancy, third trimester: Secondary | ICD-10-CM

## 2024-03-05 DIAGNOSIS — Z8489 Family history of other specified conditions: Secondary | ICD-10-CM

## 2024-03-05 DIAGNOSIS — O361920 Maternal care for other isoimmunization, second trimester, not applicable or unspecified: Secondary | ICD-10-CM

## 2024-03-05 DIAGNOSIS — O24419 Gestational diabetes mellitus in pregnancy, unspecified control: Secondary | ICD-10-CM | POA: Insufficient documentation

## 2024-03-05 DIAGNOSIS — O361931 Maternal care for other isoimmunization, third trimester, fetus 1: Secondary | ICD-10-CM | POA: Insufficient documentation

## 2024-03-05 DIAGNOSIS — O24410 Gestational diabetes mellitus in pregnancy, diet controlled: Secondary | ICD-10-CM

## 2024-03-05 DIAGNOSIS — D279 Benign neoplasm of unspecified ovary: Secondary | ICD-10-CM

## 2024-03-05 DIAGNOSIS — O3483 Maternal care for other abnormalities of pelvic organs, third trimester: Secondary | ICD-10-CM

## 2024-03-05 DIAGNOSIS — Z8759 Personal history of other complications of pregnancy, childbirth and the puerperium: Secondary | ICD-10-CM | POA: Insufficient documentation

## 2024-03-05 DIAGNOSIS — O360130 Maternal care for anti-D [Rh] antibodies, third trimester, not applicable or unspecified: Secondary | ICD-10-CM

## 2024-03-05 DIAGNOSIS — O00202 Left ovarian pregnancy without intrauterine pregnancy: Secondary | ICD-10-CM | POA: Diagnosis not present

## 2024-03-05 NOTE — Progress Notes (Signed)
 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 G2P1001 at [redacted]w[redacted]d here for ultrasound and consultation. She had low risk aneuploidy screening of a female fetus. Carrier screening was negative for the integrated 2 test. Maternal serum AFP was declined. She has no acute concerns.   Today we focused on the following:   Anti-kell antibody: I discussed the clinical significance of this condition on her pregnancy.  Originally the titer was too weak to titer but now is 1: 128 which is high risk for fetal anemia if the fetus has the antigen for the Kell antibody.  I discussed that antibodies can cross the placenta and attack fetal red blood cells if the fetus expresses the same antigen as the antibody detects.  This may result in significant anemia, fetal hydrops and even fetal death.  I discussed the various options of noninvasive testing of the fetal antigen status through [facility].  The second option would be to test the father of the baby if paternity is known.  The third option would be to perform amniocentesis.  The fourth option would be to assume the fetus has the antigen and perform serial MCA Dopplers to detect fetal anemia.  After counseling the patient about her various options they elected to pursue the [facility] test with our genetic counselor.  She will return in 2 weeks for MCA Dopplers and a limited ultrasound if the  antigen is detected on the noninvasive screening.  Gestational diabetes, diet-controlled: The patient failed her glucose screening by a few points and has been checking her blood sugars and they have all been normal.  I discussed the importance of proper glycemic control during pregnancy and the potential fetal complications such as increased risk of stillbirth, fetal growth abnormalities and postdelivery complications such as inability to control body temperature, regulate electrolytes and respiratory issues.  I encouraged the patient that none of these complications are significantly increased if glycemic levels are normal.  In the event that she is able to control her diabetes with diet alone then only serial growth ultrasounds are required.  Antenatal testing is indicated if medication is needed to control blood sugars.  At this time since the patient has reported normal blood sugars, if the glucose values continue to be normal after 2 to 3 weeks then she can discontinue postprandial blood sugar assessment and only follow the fasting blood sugars to reduce the burden of fingersticks.  History of a blood transfusion due to postpartum hemorrhage: The patient reports that after normal delivery she had significant postpartum hemorrhage resulting in a blood transfusion.  I discussed this is likely where she obtained the anti-Kell antibodies.  She does not recall any risk factors for hemorrhage and was not induced I did not have any significant lacerations.  The most likely cause is uterine atony.  I discussed that they delivering physician should take appropriate precautions to minimize the risk of hemorrhage at  birth.  Rh-: RhoGAM is still indicated since she does not have anti-D antibodies.  This should be given at 28 weeks and postpartum with the fetus is Rh positive.   Father of the baby with family history of Willian Romp: The father of this baby reports that in a previous pregnancy with another  woman he had a son with Lillian Romp.  I briefly discussed the genetic inheritance and risks of this fetus.  Since we do not know how his son received Beckwith Weidman the route of testing for this pregnancy would be unclear.  The first step would be to know what type of mutation his son has and then to know if either parents also have that mutation.  This would then help us  better understand the risks to the current pregnancy.  Genetic counseling can be considered at a later point in time  Dermoid of left ovary: This appears small about 2 to 3 cm in the largest diameter.  This can be monitored postpartum with ultrasound. Sonographic findings Single intrauterine pregnancy at 29w 2d  Fetal cardiac activity:  Observed and appears normal. Presentation: Cephalic. The anatomic structures that were well seen appear normal without evidence of soft markers. Due to poor acoustic windows some structures remain suboptimally visualized. Fetal biometry shows the estimated fetal weight at the 61 percentile.  Amniotic fluid: Within normal limits.  MVP: 3.91 cm. Placenta: Anterior. Adnexa: Dermoid cyst on left ovary (small)  MCA dopplers: Within normal limts  There are limitations of prenatal ultrasound such as the inability to detect certain abnormalities due to poor visualization. Various factors such as fetal position, gestational age and maternal body habitus may increase the difficulty in visualizing the fetal anatomy.    Recommendations - EDD should be 05/19/2024 based on  Early Ultrasound  (10/06/23). -Continue glucose testing.  If all values are normal in the next 2 to 3 weeks then the patient can discontinue postprandial assessment and only do fasting blood sugars the rest of the pregnancy - [facility] testing for Kell antigen status of the fetus - MCA Dopplers will be continued every 1 to 2 weeks until the fetal antigen status is known.  If the fetal antigen status is negative for the Kell antigen then  MCA Dopplers can be discontinued.  - Antenatal testing can be done if the fetal antigen status is positive for the Kell antigen or if the patient requires antidiabetic medication - Serial growth ultrasounds due to gestational diabetes - Genetic counseling can be considered in the future to better understand the father of the baby's history of Willian Romp and a previous pregnancy - Delivery timing likely around 67 to 39 weeks pending the clinical course - Prophylactic administration of TXA and/or Cytotec  should be considered due to the patient's history of postpartum hemorrhage - Assess for anemia throughout the pregnancy with type and crossmatch at the time of admission in the event a blood transfusion is necessary depending on the patient's hemoglobin level  Review of Systems: A review of systems was performed and was negative except per HPI   Past Obstetrical History:  OB History  Gravida Para Term Preterm AB Living  2 1 1   1   SAB IAB Ectopic Multiple Live Births     0 1    # Outcome Date GA Lbr Len/2nd Weight Sex Type Anes PTL Lv  2 Current           1 Term 07/11/18 [redacted]w[redacted]d 487:05 / 00:30 8 lb 6.4 oz (  3.81 kg) F Vag-Spont EPI  LIV     Past Medical History:  Past Medical History:  Diagnosis Date   Diabetes mellitus without complication (HCC)    Gestational diabetes    Leakage of amniotic fluid 02/17/2024   Medical history non-contributory    Seasonal allergies      Past Surgical History:    Past Surgical History:  Procedure Laterality Date   childbirth     NO PAST SURGERIES       Home Medications:   Current Outpatient Medications on File Prior to Visit  Medication Sig Dispense Refill   Prenatal Vit-Fe Fumarate-FA (PRENATAL VITAMINS PO) Take by mouth.     Accu-Chek Softclix Lancets lancets Use as instructed to check blood sugar 4 times daily 100 each 12   aspirin EC 81 MG tablet Take 1 tablet (81 mg total) by mouth daily. Swallow whole. (Patient not taking: Reported  on 03/05/2024) 90 tablet 3   Blood Glucose Monitoring Suppl (ACCU-CHEK GUIDE ME) w/Device KIT 1 each by Does not apply route 4 (four) times daily. 1 kit 0   glucose blood test strip Use as instructed to check blood sugar four times daily 100 each 12   ondansetron  (ZOFRAN -ODT) 4 MG disintegrating tablet Take 1 tablet (4 mg total) by mouth every 6 (six) hours as needed for nausea. (Patient not taking: Reported on 03/05/2024) 30 tablet 2   No current facility-administered medications on file prior to visit.     Allergies:   Allergies  Allergen Reactions   Latex Rash     Physical Exam:   Vitals:   03/05/24 0706  BP: (!) 116/55  Pulse: 84   Sitting comfortably on the sonogram table Nonlabored breathing Normal rate and rhythm Abdomen is nontender  Thank you for the opportunity to be involved with this patient's care. Please let us  know if we can be of any further assistance.   65 minutes of time was spent reviewing the patient's chart including labs, imaging and documentation.  At least 50% of this time was spent with direct patient care discussing the diagnosis, management and prognosis of her care.  [name] MFM, [facility]   03/05/2024  9:45 AM

## 2024-03-08 NOTE — Progress Notes (Deleted)
 Patient was seen on *** for Gestational Diabetes self-management class at the [facility]. The following learning objectives were met by the patient during this course:  States the definition of Gestational Diabetes States why dietary management is important in controlling blood glucose Describes the effects each nutrient has on blood glucose levels Demonstrates ability to create a balanced meal plan Demonstrates carbohydrate counting  States when to check blood glucose levels Demonstrates proper blood glucose monitoring techniques States the effect of stress and exercise on blood glucose levels States the importance of limiting caffeine and abstaining from alcohol and smoking  Blood glucose monitor given: *** Lot # *** Exp: *** Blood glucose reading: ***  *** Patient has a meter prior to visit. Patient is *** testing pre breakfast and 2 hours after each meal. FBS: *** Postprandial: *** Blood glucose today in class ***  Patient instructed to monitor glucose levels:  QID FBS: 60 - <95 1 hour: <140 2 hour: <120  *Patient received handouts: Nutrition Diabetes and Pregnancy Carbohydrate Counting List Blood glucose log Snack ideas for diabetes during pregnancy Plate Planner  Patient will be seen for follow-up as needed.

## 2024-03-10 ENCOUNTER — Encounter: Payer: Self-pay | Admitting: Advanced Practice Midwife

## 2024-03-10 ENCOUNTER — Ambulatory Visit

## 2024-03-10 DIAGNOSIS — O24410 Gestational diabetes mellitus in pregnancy, diet controlled: Secondary | ICD-10-CM

## 2024-03-10 DIAGNOSIS — O0990 Supervision of high risk pregnancy, unspecified, unspecified trimester: Secondary | ICD-10-CM

## 2024-03-15 ENCOUNTER — Telehealth: Payer: Self-pay

## 2024-03-15 NOTE — Telephone Encounter (Signed)
 Attempted to call patient with [facility] NIPS that showed fetal Kell and RhD antigen were not detected. Unable to leave message.

## 2024-03-16 ENCOUNTER — Ambulatory Visit: Admitting: Women's Health

## 2024-03-16 ENCOUNTER — Encounter: Payer: Self-pay | Admitting: Women's Health

## 2024-03-16 VITALS — BP 112/73 | HR 64 | Wt 175.0 lb

## 2024-03-16 DIAGNOSIS — Z331 Pregnant state, incidental: Secondary | ICD-10-CM

## 2024-03-16 DIAGNOSIS — O0993 Supervision of high risk pregnancy, unspecified, third trimester: Secondary | ICD-10-CM

## 2024-03-16 DIAGNOSIS — Z3A30 30 weeks gestation of pregnancy: Secondary | ICD-10-CM

## 2024-03-16 DIAGNOSIS — O361930 Maternal care for other isoimmunization, third trimester, not applicable or unspecified: Secondary | ICD-10-CM | POA: Diagnosis not present

## 2024-03-16 DIAGNOSIS — Z6791 Unspecified blood type, Rh negative: Secondary | ICD-10-CM

## 2024-03-16 DIAGNOSIS — O0990 Supervision of high risk pregnancy, unspecified, unspecified trimester: Secondary | ICD-10-CM

## 2024-03-16 DIAGNOSIS — Z1389 Encounter for screening for other disorder: Secondary | ICD-10-CM

## 2024-03-16 DIAGNOSIS — O26893 Other specified pregnancy related conditions, third trimester: Secondary | ICD-10-CM

## 2024-03-16 DIAGNOSIS — Z2913 Encounter for prophylactic Rho(D) immune globulin: Secondary | ICD-10-CM | POA: Diagnosis not present

## 2024-03-16 DIAGNOSIS — O24410 Gestational diabetes mellitus in pregnancy, diet controlled: Secondary | ICD-10-CM | POA: Diagnosis not present

## 2024-03-16 LAB — POCT URINALYSIS DIPSTICK OB
Blood, UA: NEGATIVE
Glucose, UA: NEGATIVE
Ketones, UA: NEGATIVE
Nitrite, UA: NEGATIVE
POC,PROTEIN,UA: NEGATIVE

## 2024-03-16 NOTE — Patient Instructions (Signed)
 Ann Hopkins, thank you for choosing our office today! We appreciate the opportunity to meet your healthcare needs. You may receive a short survey by mail, e-mail, or through [employer]. If you are happy with your care we would appreciate if you could take just a few minutes to complete the survey questions. We read all of your comments and take your feedback very seriously. Thank you again for choosing our office.  Center for [employer] Team at [facility]  [facility] at [facility] ([address] [place], [place] [address]) Entrance C, located off of [address] Free 24/7 valet parking   CLASSES: Go to [email or web address] to register for classes (childbirth, breastfeeding, waterbirth, infant CPR, daddy bootcamp, etc.)  Call the office [phone]) or go to [facility] if: You begin to have strong, frequent contractions Your water breaks.  Sometimes it is a big gush of fluid, sometimes it is just a trickle that keeps getting your panties wet or running down your legs You have vaginal bleeding.  It is normal to have a small amount of spotting if your cervix was checked.  You don't feel your baby moving like normal.  If you don't, get you something to eat and drink and lay down and focus on feeling your baby move.   If your baby is still not moving like normal, you should call the office or go to [facility].  Call the office [phone]) or go to [facility] hospital for these signs of pre-eclampsia: Severe headache that does not go away with Tylenol Visual changes- seeing spots, double, blurred vision Pain under your right breast or upper abdomen that does not go away with Tums or heartburn medicine Nausea and/or vomiting Severe swelling in your hands, feet, and face   Tdap Vaccine It is recommended that you get the Tdap vaccine during the third trimester of EACH pregnancy to help protect your baby from getting pertussis (whooping cough) 27-36 weeks is the BEST time to do  this so that you can pass the protection on to your baby. During pregnancy is better than after pregnancy, but if you are unable to get it during pregnancy it will be offered at the hospital.  You can get this vaccine with [place], at the health department, your family doctor, or some local pharmacies Everyone who will be around your baby should also be up-to-date on their vaccines before the baby comes. Adults (who are not pregnant) only need 1 dose of Tdap during adulthood.   [facility] Pediatricians/Family Doctors [facility]): [address] Colette Ribas, [phone]           [facility]: [address], [phone]                [facility]): [address], [phone] (call to ask if accepting patients) [facility] Department: [address], [place], [phone]    [facility] Pediatricians/Family Doctors [facility]): [phone] [address], [phone] [facility]: [address] [place], [phone] [facility]: [address], [phone]  [facility] Doctors  [facility]): [phone] [facility]: [address], [phone]   [facility] Doctors [facility]: [address], [phone]  [facility] Family Doctors  [facility]: [phone], [phone]  Home Blood Pressure Monitoring for Patients   Your provider has recommended that you check your  blood pressure (BP) at least once a week at home. If you do not have a blood pressure cuff at home, one will be provided for you. Contact your provider if you have not received your monitor within 1 week.   Helpful Tips for Accurate Home Blood Pressure Checks  Don't smoke, exercise, or drink caffeine 30 minutes before checking your BP Use the restroom before checking your BP (a full bladder can raise your  pressure) Relax in a comfortable upright chair Feet on the ground Left arm resting comfortably on a flat surface at the level of your heart Legs uncrossed Back supported Sit quietly and don't talk Place the cuff on your bare arm Adjust snuggly, so that only two fingertips can fit between your skin and the top of the cuff Check 2 readings separated by at least one minute Keep a log of your BP readings For a visual, please reference this diagram: http://ccnc.care/bpdiagram  Provider Name: [facility]     Phone: [phone]  Zone 1: ALL CLEAR  Continue to monitor your symptoms:  BP reading is less than 140 (top number) or less than 90 (bottom number)  No right upper stomach pain No headaches or seeing spots No feeling nauseated or throwing up No swelling in face and hands  Zone 2: CAUTION Call your doctor's office for any of the following:  BP reading is greater than 140 (top number) or greater than 90 (bottom number)  Stomach pain under your ribs in the middle or right side Headaches or seeing spots Feeling nauseated or throwing up Swelling in face and hands  Zone 3: EMERGENCY  Seek immediate medical care if you have any of the following:  BP reading is greater than160 (top number) or greater than 110 (bottom number) Severe headaches not improving with Tylenol Serious difficulty catching your breath Any worsening symptoms from Zone 2   Third Trimester of Pregnancy The third trimester is from week 29 through week 42, months 7 through 9. The third trimester is a time when the fetus is growing rapidly. At the end of the ninth month, the fetus is about 20 inches in length and weighs 6-10 pounds.  BODY CHANGES Your body goes through many changes during pregnancy. The changes vary from woman to woman.  Your weight will continue to increase. You can expect to gain 25-35 pounds (11-16 kg) by the end of the pregnancy. You may begin to get stretch marks on your hips, abdomen,  and breasts. You may urinate more often because the fetus is moving lower into your pelvis and pressing on your bladder. You may develop or continue to have heartburn as a result of your pregnancy. You may develop constipation because certain hormones are causing the muscles that push waste through your intestines to slow down. You may develop hemorrhoids or swollen, bulging veins (varicose veins). You may have pelvic pain because of the weight gain and pregnancy hormones relaxing your joints between the bones in your pelvis. Backaches may result from overexertion of the muscles supporting your posture. You may have changes in your hair. These can include thickening of your hair, rapid growth, and changes in texture. Some women also have hair loss during or after pregnancy, or hair that feels dry or thin. Your hair will most likely return to normal after your baby is born. Your breasts will continue to grow and be tender. A yellow discharge may leak from your breasts called colostrum. Your belly button may stick out. You may  feel short of breath because of your expanding uterus. You may notice the fetus "dropping," or moving lower in your abdomen. You may have a bloody mucus discharge. This usually occurs a few days to a week before labor begins. Your cervix becomes thin and soft (effaced) near your due date. WHAT TO EXPECT AT YOUR PRENATAL EXAMS  You will have prenatal exams every 2 weeks until week 36. Then, you will have weekly prenatal exams. During a routine prenatal visit: You will be weighed to make sure you and the fetus are growing normally. Your blood pressure is taken. Your abdomen will be measured to track your baby's growth. The fetal heartbeat will be listened to. Any test results from the previous visit will be discussed. You may have a cervical check near your due date to see if you have effaced. At around 36 weeks, your caregiver will check your cervix. At the same time, your  caregiver will also perform a test on the secretions of the vaginal tissue. This test is to determine if a type of bacteria, Group B streptococcus, is present. Your caregiver will explain this further. Your caregiver may ask you: What your birth plan is. How you are feeling. If you are feeling the baby move. If you have had any abnormal symptoms, such as leaking fluid, bleeding, severe headaches, or abdominal cramping. If you have any questions. Other tests or screenings that may be performed during your third trimester include: Blood tests that check for low iron levels (anemia). Fetal testing to check the health, activity level, and growth of the fetus. Testing is done if you have certain medical conditions or if there are problems during the pregnancy. FALSE LABOR You may feel small, irregular contractions that eventually go away. These are called Braxton Hicks contractions, or false labor. Contractions may last for hours, days, or even weeks before true labor sets in. If contractions come at regular intervals, intensify, or become painful, it is best to be seen by your caregiver.  SIGNS OF LABOR  Menstrual-like cramps. Contractions that are 5 minutes apart or less. Contractions that start on the top of the uterus and spread down to the lower abdomen and back. A sense of increased pelvic pressure or back pain. A watery or bloody mucus discharge that comes from the vagina. If you have any of these signs before the 37th week of pregnancy, call your caregiver right away. You need to go to the hospital to get checked immediately. HOME CARE INSTRUCTIONS  Avoid all smoking, herbs, alcohol, and unprescribed drugs. These chemicals affect the formation and growth of the baby. Follow your caregiver's instructions regarding medicine use. There are medicines that are either safe or unsafe to take during pregnancy. Exercise only as directed by your caregiver. Experiencing uterine cramps is a good sign to  stop exercising. Continue to eat regular, healthy meals. Wear a good support bra for breast tenderness. Do not use hot tubs, steam rooms, or saunas. Wear your seat belt at all times when driving. Avoid raw meat, uncooked cheese, cat litter boxes, and soil used by cats. These carry germs that can cause birth defects in the baby. Take your prenatal vitamins. Try taking a stool softener (if your caregiver approves) if you develop constipation. Eat more high-fiber foods, such as fresh vegetables or fruit and whole grains. Drink plenty of fluids to keep your urine clear or pale yellow. Take warm sitz baths to soothe any pain or discomfort caused by hemorrhoids. Use hemorrhoid cream if  your caregiver approves. If you develop varicose veins, wear support hose. Elevate your feet for 15 minutes, 3-4 times a day. Limit salt in your diet. Avoid heavy lifting, wear low heal shoes, and practice good posture. Rest a lot with your legs elevated if you have leg cramps or low back pain. Visit your dentist if you have not gone during your pregnancy. Use a soft toothbrush to brush your teeth and be gentle when you floss. A sexual relationship may be continued unless your caregiver directs you otherwise. Do not travel far distances unless it is absolutely necessary and only with the approval of your caregiver. Take prenatal classes to understand, practice, and ask questions about the labor and delivery. Make a trial run to the hospital. Pack your hospital bag. Prepare the baby's nursery. Continue to go to all your prenatal visits as directed by your caregiver. SEEK MEDICAL CARE IF: You are unsure if you are in labor or if your water has broken. You have dizziness. You have mild pelvic cramps, pelvic pressure, or nagging pain in your abdominal area. You have persistent nausea, vomiting, or diarrhea. You have a bad smelling vaginal discharge. You have pain with urination. SEEK IMMEDIATE MEDICAL CARE IF:  You  have a fever. You are leaking fluid from your vagina. You have spotting or bleeding from your vagina. You have severe abdominal cramping or pain. You have rapid weight loss or gain. You have shortness of breath with chest pain. You notice sudden or extreme swelling of your face, hands, ankles, feet, or legs. You have not felt your baby move in over an hour. You have severe headaches that do not go away with medicine. You have vision changes. Document Released: 05/14/2001 Document Revised: 05/25/2013 Document Reviewed: 07/21/2012 [facility] Patient Information 2015 [place], [place]. This information is not intended to replace advice given to you by your health care provider. Make sure you discuss any questions you have with your health care provider.

## 2024-03-16 NOTE — Progress Notes (Addendum)
 HIGH-RISK PREGNANCY VISIT Patient name: Ann Hopkins MRN [id number]  Date of birth: 31-Mar-2001 Chief Complaint:   Routine Prenatal Visit (Rhogam today)  History of Present Illness:   Ann Hopkins is a 23 y.o. G84P1001 female at [redacted]w[redacted]d with an Estimated Date of Delivery: 05/19/24 being seen today for ongoing management of a high-risk pregnancy complicated by diabetes mellitus A1DM and +Anti-Kell antibody.    Today she reports checking sugars multiple times daily, before meals/after meals, when she feels dizzy, etc. Some high, some just read LO. Meets w/ dietician tomorrow.. Contractions: Not present.  .  Movement: Present. denies leaking of fluid.      03/02/2024    9:30 AM 11/10/2023    8:42 AM 08/26/2018    9:48 AM 12/08/2017    3:36 PM  Depression screen PHQ 2/9  Decreased Interest 0 2 0 0  Down, Depressed, Hopeless 0 0 0 0  PHQ - 2 Score 0 2 0 0  Altered sleeping 0 0 0 0  Tired, decreased energy 0 2 0 1  Change in appetite 0 2 0 1  Feeling bad or failure about yourself  0 0 0 0  Trouble concentrating 0 0 0 0  Moving slowly or fidgety/restless 0 0 0 0  Suicidal thoughts 0 0 0 0  PHQ-9 Score 0 6 0 2  Difficult doing work/chores   Not difficult at all         03/02/2024    9:30 AM 11/10/2023    8:43 AM  GAD 7 : Generalized Anxiety Score  Nervous, Anxious, on Edge 0 0  Control/stop worrying 0 0  Worry too much - different things 0 0  Trouble relaxing 0 0  Restless 0 0  Easily annoyed or irritable 0 0  Afraid - awful might happen 0 0  Total GAD 7 Score 0 0     Review of Systems:   Pertinent items are noted in HPI Denies abnormal vaginal discharge w/ itching/odor/irritation, headaches, visual changes, shortness of breath, chest pain, abdominal pain, severe nausea/vomiting, or problems with urination or bowel movements unless otherwise stated above. Pertinent History Reviewed:  Reviewed past medical,surgical, social, obstetrical and family history.  Reviewed problem list,  medications and allergies. Physical Assessment:   Vitals:   03/16/24 1054  BP: 112/73  Pulse: 64  Weight: 175 lb (79.4 kg)  Body mass index is 34.18 kg/m.           Physical Examination:   General appearance: alert, well appearing, and in no distress  Mental status: alert, oriented to person, place, and time  Skin: warm & dry   Extremities: Edema: None    Cardiovascular: normal heart rate noted  Respiratory: normal respiratory effort, no distress  Abdomen: gravid, soft, non-tender  Pelvic: Cervical exam deferred         Fetal Status: Fetal Heart Rate (bpm): 150 Fundal Height: 29 cm Movement: Present    Fetal Surveillance Testing today: doppler   Chaperone: N/A  Results for orders placed or performed in visit on 03/16/24 (from the past 24 hours)  POC Urinalysis Dipstick OB   Collection Time: 03/16/24 11:06 AM  Result Value Ref Range   Color, UA     Clarity, UA     Glucose, UA Negative Negative   Bilirubin, UA     Ketones, UA negative    Spec Grav, UA     Blood, UA negative    pH, UA     POC,PROTEIN,UA Negative  Negative, Trace, Small (1+), Moderate (2+), Large (3+), 4+   Urobilinogen, UA     Nitrite, UA negative    Leukocytes, UA Trace (A) Negative   Appearance     Odor      Assessment & Plan:  High-risk pregnancy: G2P1001 at [redacted]w[redacted]d with an Estimated Date of Delivery: 05/19/24   1) A1DM, keep appt w/ dietician tomorrow, be really good about diet, only check sugars QID (if feels dizzy/lightheaded can check), f/u 1wk   2) +Anti-Kell antibody, 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   Labs/procedures today: Rhogam  Treatment Plan:  per MFM (Anti-Kell)  GDM   EFW q 4-6w     No antenatal testing as long as remains off meds     Deliver @ 39-39.6wks:____   Reviewed: Preterm labor symptoms and general obstetric precautions including but not  limited to vaginal bleeding, contractions, leaking of fluid and fetal movement were reviewed in detail with the patient.  All questions were answered. Does have home bp cuff. Office bp cuff given: not applicable. Check bp weekly, let us  know if consistently >140 and/or >90.  Follow-up: Return in about 1 week (around 03/23/2024) for HROB, MD, in person.   Future Appointments  Date Time Provider Department Center  03/17/2024  2:00 PM [facility] None  03/23/2024  8:50 AM Kizzie Suzen SAUNDERS, CNM [facility]  03/25/2024 10:15 AM [facility] PROVIDER 1 [facility]  03/25/2024 10:30 AM [facility] US2 [facility]  04/01/2024  7:15 AM [facility] PROVIDER 1 [facility]  04/01/2024  7:30 AM [facility] US1 [facility]    Orders Placed This Encounter  Procedures   RHO (D) Immune Globulin    POC Urinalysis Dipstick OB   Suzen SAUNDERS Kizzie CNM, [facility] 03/16/2024 1:58 PM

## 2024-03-17 ENCOUNTER — Encounter: Payer: Self-pay | Admitting: Dietician

## 2024-03-17 ENCOUNTER — Encounter: Attending: Women's Health | Admitting: Dietician

## 2024-03-17 DIAGNOSIS — O0993 Supervision of high risk pregnancy, unspecified, third trimester: Secondary | ICD-10-CM | POA: Insufficient documentation

## 2024-03-17 DIAGNOSIS — Z3A29 29 weeks gestation of pregnancy: Secondary | ICD-10-CM | POA: Diagnosis present

## 2024-03-17 DIAGNOSIS — O24410 Gestational diabetes mellitus in pregnancy, diet controlled: Secondary | ICD-10-CM | POA: Insufficient documentation

## 2024-03-17 DIAGNOSIS — Z713 Dietary counseling and surveillance: Secondary | ICD-10-CM | POA: Insufficient documentation

## 2024-03-17 NOTE — Progress Notes (Signed)
 Patient was seen on 03/17/2024 for Gestational Diabetes self-management class at the [facility]. The following learning objectives were met by the patient during this course:  States the definition of Gestational Diabetes States why dietary management is important in controlling blood glucose Describes the effects each nutrient has on blood glucose levels Demonstrates ability to create a balanced meal plan Demonstrates carbohydrate counting  States when to check blood glucose levels Demonstrates proper blood glucose monitoring techniques States the effect of stress and exercise on blood glucose levels States the importance of limiting caffeine and abstaining from alcohol and smoking  Patient has a meter prior to visit. Patient is testing pre breakfast and 2 hours after each meal. FBS: ~90 Postprandial: <145   Patient instructed to monitor glucose levels:  QID FBS: 60 - <95 1 hour: <140 2 hour: <120  *Patient received handouts: Nutrition Diabetes and Pregnancy Carbohydrate Counting List Blood glucose log Snack ideas for diabetes during pregnancy Plate Planner  Patient will be seen for follow-up as needed.

## 2024-03-18 NOTE — Telephone Encounter (Addendum)
 I called the patient to return her [facility] fetal antigen NIPS results. Fetal Kell antigen and RhD antigen were not detected in the sample. There is a very low chance of the fetus developing hemolytic disease of the newborn. Please see report for details. Fetal surveillance will depend on the MFM's discretion.  Her upcoming [facility] appointment is on 03/25/2024.  Lauraine Bodily, MS, [facility] Certified Genetic Counselor [facility] for Maternal Fetal [facility] [phone]

## 2024-03-22 ENCOUNTER — Encounter: Payer: Self-pay | Admitting: Advanced Practice Midwife

## 2024-03-23 ENCOUNTER — Encounter: Admitting: Women's Health

## 2024-03-24 ENCOUNTER — Encounter: Admitting: Women's Health

## 2024-03-25 ENCOUNTER — Encounter: Payer: Self-pay | Admitting: Women's Health

## 2024-03-25 ENCOUNTER — Ambulatory Visit (HOSPITAL_BASED_OUTPATIENT_CLINIC_OR_DEPARTMENT_OTHER): Admitting: Maternal & Fetal Medicine

## 2024-03-25 ENCOUNTER — Ambulatory Visit: Attending: Maternal & Fetal Medicine

## 2024-03-25 ENCOUNTER — Ambulatory Visit: Admitting: Women's Health

## 2024-03-25 ENCOUNTER — Other Ambulatory Visit: Payer: Self-pay | Admitting: *Deleted

## 2024-03-25 VITALS — BP 131/65 | HR 93

## 2024-03-25 VITALS — BP 115/77 | HR 96 | Wt 176.0 lb

## 2024-03-25 DIAGNOSIS — O24419 Gestational diabetes mellitus in pregnancy, unspecified control: Secondary | ICD-10-CM

## 2024-03-25 DIAGNOSIS — E669 Obesity, unspecified: Secondary | ICD-10-CM | POA: Diagnosis not present

## 2024-03-25 DIAGNOSIS — O361931 Maternal care for other isoimmunization, third trimester, fetus 1: Secondary | ICD-10-CM

## 2024-03-25 DIAGNOSIS — Z3A32 32 weeks gestation of pregnancy: Secondary | ICD-10-CM | POA: Insufficient documentation

## 2024-03-25 DIAGNOSIS — O361930 Maternal care for other isoimmunization, third trimester, not applicable or unspecified: Secondary | ICD-10-CM

## 2024-03-25 DIAGNOSIS — D279 Benign neoplasm of unspecified ovary: Secondary | ICD-10-CM

## 2024-03-25 DIAGNOSIS — O24410 Gestational diabetes mellitus in pregnancy, diet controlled: Secondary | ICD-10-CM | POA: Diagnosis not present

## 2024-03-25 DIAGNOSIS — O0993 Supervision of high risk pregnancy, unspecified, third trimester: Secondary | ICD-10-CM

## 2024-03-25 DIAGNOSIS — O3483 Maternal care for other abnormalities of pelvic organs, third trimester: Secondary | ICD-10-CM

## 2024-03-25 DIAGNOSIS — O99213 Obesity complicating pregnancy, third trimester: Secondary | ICD-10-CM

## 2024-03-25 DIAGNOSIS — O0990 Supervision of high risk pregnancy, unspecified, unspecified trimester: Secondary | ICD-10-CM

## 2024-03-25 DIAGNOSIS — Z331 Pregnant state, incidental: Secondary | ICD-10-CM

## 2024-03-25 DIAGNOSIS — Z1389 Encounter for screening for other disorder: Secondary | ICD-10-CM

## 2024-03-25 LAB — POCT URINALYSIS DIPSTICK OB
Blood, UA: NEGATIVE
Glucose, UA: NEGATIVE
Ketones, UA: NEGATIVE
Nitrite, UA: NEGATIVE
POC,PROTEIN,UA: NEGATIVE

## 2024-03-25 MED ORDER — METFORMIN HCL 500 MG PO TABS: 500.0000 mg | ORAL_TABLET | Freq: Two times a day (BID) | ORAL | 3 refills | Status: DC

## 2024-03-25 NOTE — Progress Notes (Signed)
 Patient information  Patient Name: Ann Hopkins  Patient MRN:   [id number]  Referring practice: MFM Referring Provider: [facility]  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 G2P1001 at [redacted]w[redacted]d here for ultrasound and consultation. [name] is doing well today with no acute concerns. Today we focused on the following:   GDMA1: The patient reports that approximately half of her blood sugars are at goal.  Her fasting blood sugar has been increasing to the high 90s more often in the low 100s as well.  Her postprandials are mostly less than 120 but when she is not at work walking around she noticed that her blood sugars are less well-controlled.  I also discussed that the ultrasound shows that there is excessive fetal weight at the 95th percentile.  This is a sign of poor control.  If her blood sugars are not at least 75% within goal then medication should be considered.  Alloimmunization: The patient has Anti-Kell antibodies with a titer that increased from 2 low to titer up to 1: 128.  She also had anti-D antibodies but were passively acquired presumably from RhoGAM.  Her most recent type and antibody screen was reflective of only the Kell antibody.  She had [facility] cell free DNA testing through our genetic counselor which showed that the fetus does not have the Kell or D antigen.  The patient had time to ask questions that were answered to her satisfaction.  She verbalized understanding and agrees to proceed with the plan below.  I  discussed that while cell-free DNA testing for fetal antigens is a fairly new technology, the data is very promising that there are little to no false negative results.  MCA Dopplers were done for additional reassurance and were normal today.  I also discussed that there may be some benefit in repeating the titer since it increased so dramatically this may be due to laboratory error or variation in the lab technology.  She will continue to have weekly antenatal testing since her diabetes is borderline controlled.  For peace of mind the patient would prefer to continue MCA Dopplers every other week.  Sonographic findings Single intrauterine pregnancy. Fetal cardiac activity: Observed. Presentation: Cephalic. Interval fetal anatomy appears normal. Fetal biometry shows the estimated fetal weight at the 95 percentile. Amniotic fluid: Within normal limits.  MVP: 3.2 cm. Placenta: Anterior. BPP: 8/8.   There are limitations of prenatal ultrasound such as the inability to detect certain abnormalities due to poor visualization. Various factors such as fetal position, gestational age and maternal body habitus may increase the difficulty in visualizing the fetal anatomy.    Recommendations - Weekly antenatal testing with MCA Dopplers every 1 to 2 weeks (although the fetal antigen status is negative based on cell free DNA testing) - Consider repeating the antibody titer to assess for a false positive increase.  If it is less than 1: 2 then MCA Dopplers would definitely not be warranted unless there are signs of hydrops fetalis. - Growth ultrasounds every 4 to  5 weeks - Delivery around 39 weeks or sooner if indicated - If diet and exercise is insufficient to control blood sugars then medication should be considered.  Given the degree of fetal macrosomia I recommend at least 75% of all glucose values be at goal.  Review of Systems: A review of systems was performed and was negative except per HPI   Vitals  and Physical Exam    03/25/2024   10:20 AM 03/16/2024   10:54 AM 03/05/2024    7:06 AM  Vitals with BMI  Weight  175 lbs   Systolic 131 112 883  Diastolic 65 73 55  Pulse 93 64 84    Sitting comfortably on the sonogram table Nonlabored breathing Normal rate and rhythm Abdomen is nontender  Past pregnancies OB History  Gravida Para Term Preterm AB Living  2 1 1   1   SAB IAB Ectopic Multiple Live Births     0 1    # Outcome Date GA Lbr Len/2nd Weight Sex Type Anes PTL Lv  2 Current           1 Term 07/11/18 [redacted]w[redacted]d 487:05 / 00:30 8 lb 6.4 oz (3.81 kg) F Vag-Spont EPI  LIV     I spent 30 minutes reviewing the patients chart, including labs and images as well as counseling the patient about her medical conditions. Greater than 50% of the time was spent in direct face-to-face patient counseling.  [name]  MFM, [facility]   03/25/2024  11:26 AM

## 2024-03-25 NOTE — Patient Instructions (Signed)
[place], thank you for choosing our office today! We appreciate the opportunity to meet your healthcare needs. You may receive a short survey by mail, e-mail, or through [employer]. If you are happy with your care we would appreciate if you could take just a few minutes to complete the survey questions. We read all of your comments and take your feedback very seriously. Thank you again for choosing our office.  Center for [employer] Team at [place] at [facility] ([place], [place] [address]) Entrance C, located off of [address] parking   CLASSES: Go to [email or web address] to register for classes (childbirth, breastfeeding, waterbirth, infant CPR, daddy bootcamp, etc.)  Call the office [phone]) or go to [facility] if: You begin to have strong, frequent contractions Your water breaks.  Sometimes it is a big gush of fluid, sometimes it is just a trickle that keeps getting your panties wet or running down your legs You have vaginal bleeding.  It is normal to have a small amount of spotting if your cervix was checked.  You don't feel your baby moving like normal.  If you don't, get you something to eat and drink and lay down and focus on feeling your baby move.   If your baby is still not moving like normal, you should call the office or go to [facility].  Call the office [phone]) or go to [facility] hospital for these signs of pre-eclampsia: Severe headache that does not go away with Tylenol Visual changes- seeing spots, double, blurred vision Pain under your right breast or upper abdomen that does not go away with Tums or heartburn medicine Nausea and/or vomiting Severe swelling in your hands, feet, and face   Tdap Vaccine It is recommended that you get the Tdap vaccine during the third trimester of EACH pregnancy to help protect your baby from getting pertussis (whooping cough) 27-36 weeks is the BEST time to do  this so that you can pass the protection on to your baby. During pregnancy is better than after pregnancy, but if you are unable to get it during pregnancy it will be offered at the hospital.  You can get this vaccine with [place], at the health department, your family doctor, or some local pharmacies Everyone who will be around your baby should also be up-to-date on their vaccines before the baby comes. Adults (who are not pregnant) only need 1 dose of Tdap during adulthood.   [facility] Pediatricians/Family Doctors [facility]): [address] [place], [facility]: [address] [place], [phone]                [facility]): [place], [phone] (call to ask if accepting patients) [facility] Department: [address], [place], [place] Pediatricians/Family Doctors [facility]): [place], [address], [place] [facility]: [address] [place], [place] [facility]: [place], [place] [facility]): [phone] [facility]: [address], [place]: [address] [facility]: [phone], [phone]  Home Blood Pressure Monitoring for Patients   Your provider has recommended that you check your  blood pressure (BP) at least once a week at home. If you do not have a blood pressure cuff at home, one will be provided for you. Contact your provider if you have not received your monitor within 1 week.   Helpful Tips for Accurate Home Blood Pressure Checks  Don't smoke, exercise, or drink caffeine 30 minutes before checking your BP Use the restroom before checking your BP (a full bladder can raise your  pressure) Relax in a comfortable upright chair Feet on the ground Left arm resting comfortably on a flat surface at the level of your heart Legs uncrossed Back supported Sit quietly and don't talk Place the cuff on your bare arm Adjust snuggly, so that only two fingertips can fit between your skin and the top of the cuff Check 2 readings separated by at least one minute Keep a log of your BP readings For a visual, please reference this diagram: http://ccnc.care/bpdiagram  Provider Name: [facility]     Phone: [phone]  Zone 1: ALL CLEAR  Continue to monitor your symptoms:  BP reading is less than 140 (top number) or less than 90 (bottom number)  No right upper stomach pain No headaches or seeing spots No feeling nauseated or throwing up No swelling in face and hands  Zone 2: CAUTION Call your doctor's office for any of the following:  BP reading is greater than 140 (top number) or greater than 90 (bottom number)  Stomach pain under your ribs in the middle or right side Headaches or seeing spots Feeling nauseated or throwing up Swelling in face and hands  Zone 3: EMERGENCY  Seek immediate medical care if you have any of the following:  BP reading is greater than160 (top number) or greater than 110 (bottom number) Severe headaches not improving with Tylenol Serious difficulty catching your breath Any worsening symptoms from Zone 2  Preterm Labor and Birth Information  The normal length of a pregnancy is 39-41 weeks. Preterm labor is when labor starts before 37 completed weeks of pregnancy. What are the risk factors for preterm labor? Preterm labor is more likely to occur in women who: Have certain infections during pregnancy such as a bladder infection, sexually transmitted infection, or infection inside the uterus (chorioamnionitis). Have a shorter-than-normal cervix. Have gone into preterm labor before. Have had surgery on their cervix. Are younger than age 17  or older than age 35. Are African American. Are pregnant with twins or multiple babies (multiple gestation). Take street drugs or smoke while pregnant. Do not gain enough weight while pregnant. Became pregnant shortly after having been pregnant. What are the symptoms of preterm labor? Symptoms of preterm labor include: Cramps similar to those that can happen during a menstrual period. The cramps may happen with diarrhea. Pain in the abdomen or lower back. Regular uterine contractions that may feel like tightening of the abdomen. A feeling of increased pressure in the pelvis. Increased watery or bloody mucus discharge from the vagina. Water breaking (ruptured amniotic sac). Why is it important to recognize signs of preterm labor? It is important to recognize signs of preterm labor because babies who are born prematurely may not be fully developed. This can put them at an increased risk for: Long-term (chronic) heart and lung problems. Difficulty immediately after birth with regulating body systems, including blood sugar, body temperature, heart rate, and breathing rate. Bleeding in the brain. Cerebral palsy. Learning difficulties. Death. These risks are highest for babies who are born before 34 weeks   of pregnancy. How is preterm labor treated? Treatment depends on the length of your pregnancy, your condition, and the health of your baby. It may involve: Having a stitch (suture) placed in your cervix to prevent your cervix from opening too early (cerclage). Taking or being given medicines, such as: Hormone medicines. These may be given early in pregnancy to help support the pregnancy. Medicine to stop contractions. Medicines to help mature the baby's lungs. These may be prescribed if the risk of delivery is high. Medicines to prevent your baby from developing cerebral palsy. If the labor happens before 34 weeks of pregnancy, you may need to stay in the hospital. What should I do if I  think I am in preterm labor? If you think that you are going into preterm labor, call your health care provider right away. How can I prevent preterm labor in future pregnancies? To increase your chance of having a full-term pregnancy: Do not use any tobacco products, such as cigarettes, chewing tobacco, and e-cigarettes. If you need help quitting, ask your health care provider. Do not use street drugs or medicines that have not been prescribed to you during your pregnancy. Talk with your health care provider before taking any herbal supplements, even if you have been taking them regularly. Make sure you gain a healthy amount of weight during your pregnancy. Watch for infection. If you think that you might have an infection, get it checked right away. Make sure to tell your health care provider if you have gone into preterm labor before. This information is not intended to replace advice given to you by your health care provider. Make sure you discuss any questions you have with your health care provider. Document Revised: 09/11/2018 Document Reviewed: 10/11/2015 Elsevier Patient Education  2020 Elsevier Inc.   

## 2024-03-25 NOTE — Addendum Note (Signed)
 Addended by: SANNA GONG A on: 03/25/2024 02:43 PM   Modules accepted: Orders

## 2024-03-25 NOTE — Progress Notes (Signed)
 HIGH-RISK PREGNANCY VISIT Patient name: Ann Hopkins MRN [id number]  Date of birth: May 22, 2001 Chief Complaint:   Routine Prenatal Visit  History of Present Illness:   Ann Hopkins is a 23 y.o. 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    Today she reports fastings 89-113 (6 of 9 ^), 2hr pp 85-144 (6 of 24 ^). Not eating things she shouldn't. Went to dietician.  Contractions: Not present.  .  Movement: Present. denies leaking of fluid.      03/17/2024    3:30 PM 03/02/2024    9:30 AM 11/10/2023    8:42 AM 08/26/2018    9:48 AM 12/08/2017    3:36 PM  Depression screen PHQ 2/9  Decreased Interest 0 0 2 0 0  Down, Depressed, Hopeless 0 0 0 0 0  PHQ - 2 Score 0 0 2 0 0  Altered sleeping  0 0 0 0  Tired, decreased energy  0 2 0 1  Change in appetite  0 2 0 1  Feeling bad or failure about yourself   0 0 0 0  Trouble concentrating  0 0 0 0  Moving slowly or fidgety/restless  0 0 0 0  Suicidal thoughts  0 0 0 0  PHQ-9 Score  0 6 0 2  Difficult doing work/chores    Not difficult at all         03/02/2024    9:30 AM 11/10/2023    8:43 AM  GAD 7 : Generalized Anxiety Score  Nervous, Anxious, on Edge 0 0  Control/stop worrying 0 0  Worry too much - different things 0 0  Trouble relaxing 0 0  Restless 0 0  Easily annoyed or irritable 0 0  Afraid - awful might happen 0 0  Total GAD 7 Score 0 0     Review of Systems:   Pertinent items are noted in HPI Denies abnormal vaginal discharge w/ itching/odor/irritation, headaches, visual changes, shortness of breath, chest pain, abdominal pain, severe nausea/vomiting, or problems with urination or bowel movements unless otherwise stated above. Pertinent History Reviewed:  Reviewed past medical,surgical, social, obstetrical and family history.  Reviewed problem list,  medications and allergies. Physical Assessment:   Vitals:   03/25/24 1350  BP: 115/77  Pulse: 96  Weight: 176 lb (79.8 kg)  Body mass index is 34.37 kg/m.           Physical Examination:   General appearance: alert, well appearing, and in no distress  Mental status: alert, oriented to person, place, and time  Skin: warm & dry   Extremities: Edema: None    Cardiovascular: normal heart rate noted  Respiratory: normal respiratory effort, no distress  Abdomen: gravid, soft, non-tender  Pelvic: Cervical exam deferred         Fetal Status:     Movement: Present    Fetal Surveillance Testing today: U/S at MFM today HR 140, BPP 8/8, EFW 95% (AC >99%), MCA dopplers normal  Chaperone: N/A  No results found for this or any previous visit (from the past 24 hours).  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    Dispense:  60 tablet    Refill:  3    Labs/procedures today: u/s w/ MFM today  Treatment Plan: EFW q 4wks       2x/wk NST or weekly bpp     Deliver 37-38.6wks or as per MFM (if uncontrolled/poly/LGA):_____   Reviewed: Preterm labor symptoms and general obstetric precautions including but not limited to vaginal bleeding, contractions, leaking of fluid and fetal movement were reviewed in detail with the patient.  All questions were answered. Does have home bp cuff. Office bp cuff given: not applicable. Check bp weekly,  let us  know if consistently >140 and/or >90.  Follow-up: Return for start Mon NST/nurse; Thurs NST/HROB w/ MD; EFW/bpp q4w (had today).   Future Appointments  Date Time Provider Department Center  03/29/2024  3:30 PM [facility] NURSE [facility]  04/01/2024  7:15 AM [facility] PROVIDER 1 [facility]  04/01/2024  7:30 AM [facility] US1 [facility]  04/01/2024 11:10 AM Marilynn Nest, DO [facility]  04/05/2024  3:30 PM [facility] NURSE [facility]  04/08/2024  3:10 PM [facility] NURSE [facility]  04/08/2024  3:10 PM Marilynn Nest, DO [facility]    Orders Placed This Encounter  Procedures   Antibody screen   Suzen JONELLE Fetters CNM, [facility] 03/25/2024 2:27 PM

## 2024-03-26 MED ORDER — BLOOD PRESSURE CUFF MISC: 0 refills | Status: DC

## 2024-03-26 NOTE — Addendum Note (Signed)
 Addended by: ILEAN RUTHERFORD HERO on: 03/26/2024 01:54 PM   Modules accepted: Orders

## 2024-03-28 LAB — AB SCR+ANTIBODY ID
Antibody Screen: POSITIVE — AB
Coombs Titer #1: 128

## 2024-03-28 LAB — ANTIBODY SCREEN

## 2024-03-29 ENCOUNTER — Ambulatory Visit (INDEPENDENT_AMBULATORY_CARE_PROVIDER_SITE_OTHER): Admitting: *Deleted

## 2024-03-29 VITALS — BP 110/62 | HR 98

## 2024-03-29 DIAGNOSIS — O0993 Supervision of high risk pregnancy, unspecified, third trimester: Secondary | ICD-10-CM

## 2024-03-29 DIAGNOSIS — O24419 Gestational diabetes mellitus in pregnancy, unspecified control: Secondary | ICD-10-CM

## 2024-03-29 DIAGNOSIS — Z3A32 32 weeks gestation of pregnancy: Secondary | ICD-10-CM | POA: Diagnosis not present

## 2024-03-29 DIAGNOSIS — O0990 Supervision of high risk pregnancy, unspecified, unspecified trimester: Secondary | ICD-10-CM

## 2024-03-29 NOTE — Progress Notes (Signed)
   NURSE VISIT- NST  SUBJECTIVE:  Ann Hopkins is a 23 y.o. G17P1001 female at [redacted]w[redacted]d, here for a NST for pregnancy complicated by Diabetes: A2DM.  She reports active fetal movement, contractions: none, vaginal bleeding: none, membranes: intact.   OBJECTIVE:  BP 110/62   Pulse 98   LMP 07/31/2023 (Exact Date)   Appears well, no apparent distress  No results found for this or any previous visit (from the past 24 hours).  NST: FHR baseline 140 bpm, Variability: moderate, Accelerations:present, Decelerations:  Absent= Cat 1/reactive Toco: none   ASSESSMENT: G2P1001 at [redacted]w[redacted]d with Diabetes: A2DM NST reactive  PLAN: EFM strip reviewed by Dr. Ozan   Recommendations: keep next appointment as scheduled    Kamarii Buren  03/29/2024 4:19 PM

## 2024-03-31 ENCOUNTER — Other Ambulatory Visit

## 2024-04-01 ENCOUNTER — Ambulatory Visit: Admitting: Obstetrics & Gynecology

## 2024-04-01 ENCOUNTER — Ambulatory Visit (HOSPITAL_BASED_OUTPATIENT_CLINIC_OR_DEPARTMENT_OTHER): Admitting: Maternal & Fetal Medicine

## 2024-04-01 ENCOUNTER — Ambulatory Visit: Attending: Maternal & Fetal Medicine

## 2024-04-01 ENCOUNTER — Encounter: Payer: Self-pay | Admitting: Obstetrics & Gynecology

## 2024-04-01 ENCOUNTER — Other Ambulatory Visit: Payer: Self-pay | Admitting: *Deleted

## 2024-04-01 VITALS — BP 135/54 | HR 102

## 2024-04-01 VITALS — BP 122/76 | HR 101 | Wt 176.6 lb

## 2024-04-01 DIAGNOSIS — O24410 Gestational diabetes mellitus in pregnancy, diet controlled: Secondary | ICD-10-CM

## 2024-04-01 DIAGNOSIS — O24419 Gestational diabetes mellitus in pregnancy, unspecified control: Secondary | ICD-10-CM

## 2024-04-01 DIAGNOSIS — O361930 Maternal care for other isoimmunization, third trimester, not applicable or unspecified: Secondary | ICD-10-CM

## 2024-04-01 DIAGNOSIS — E669 Obesity, unspecified: Secondary | ICD-10-CM

## 2024-04-01 DIAGNOSIS — O99213 Obesity complicating pregnancy, third trimester: Secondary | ICD-10-CM

## 2024-04-01 DIAGNOSIS — O360130 Maternal care for anti-D [Rh] antibodies, third trimester, not applicable or unspecified: Secondary | ICD-10-CM | POA: Diagnosis not present

## 2024-04-01 DIAGNOSIS — D279 Benign neoplasm of unspecified ovary: Secondary | ICD-10-CM

## 2024-04-01 DIAGNOSIS — Z3A33 33 weeks gestation of pregnancy: Secondary | ICD-10-CM

## 2024-04-01 DIAGNOSIS — O3483 Maternal care for other abnormalities of pelvic organs, third trimester: Secondary | ICD-10-CM

## 2024-04-01 DIAGNOSIS — O0993 Supervision of high risk pregnancy, unspecified, third trimester: Secondary | ICD-10-CM

## 2024-04-01 DIAGNOSIS — O0990 Supervision of high risk pregnancy, unspecified, unspecified trimester: Secondary | ICD-10-CM

## 2024-04-01 LAB — GLUCOSE, POCT (MANUAL RESULT ENTRY): POC Glucose: 78 mg/dL (ref 70–99)

## 2024-04-01 MED ORDER — BLOOD PRESSURE CUFF MISC: 1.0000 | 0 refills | Status: DC | PRN

## 2024-04-01 NOTE — Progress Notes (Signed)
 After review, MFM consult with provider is not indicated for today  William Glenn, DO 04/01/2024 10:58 AM  [facility]

## 2024-04-01 NOTE — Progress Notes (Addendum)
 HIGH-RISK PREGNANCY VISIT Patient name: Ann Hopkins MRN [id number]  Date of birth: November 26, 2000 Chief Complaint:   Routine Prenatal Visit  History of Present Illness:   [name] is a 23 y.o. 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   Contractions: Not present. Vag. Bleeding: None.  Movement: Present. denies leaking of fluid.      03/17/2024    3:30 PM 03/02/2024    9:30 AM 11/10/2023    8:42 AM 08/26/2018    9:48 AM 12/08/2017    3:36 PM  Depression screen PHQ 2/9  Decreased Interest 0 0 2 0 0  Down, Depressed, Hopeless 0 0 0 0 0  PHQ - 2 Score 0 0 2 0 0  Altered sleeping  0 0 0 0  Tired, decreased energy  0 2 0 1  Change in appetite  0 2 0 1  Feeling bad or failure about yourself   0 0 0 0  Trouble concentrating  0 0 0 0  Moving slowly or fidgety/restless  0 0 0 0  Suicidal thoughts  0 0 0 0  PHQ-9 Score  0 6 0 2  Difficult doing work/chores    Not difficult at all      Current Outpatient Medications  Medication Instructions   Accu-Chek Softclix Lancets lancets Use as instructed to check blood sugar 4 times daily   aspirin EC 81 mg, Oral, Daily, Swallow whole.   Blood Glucose Monitoring Suppl (ACCU-CHEK GUIDE ME) w/Device KIT 1 each, Does not apply, 4 times daily   Blood Pressure Monitoring (BLOOD PRESSURE CUFF) MISC Use as directed to check blood pressure daily   Blood Pressure Monitoring (BLOOD PRESSURE CUFF) MISC 1 kit, Does not apply, As needed   glucose blood test strip Use as instructed to check blood sugar four times daily   metFORMIN (GLUCOPHAGE) 500 mg, Oral, 2 times daily with meals   ondansetron  (ZOFRAN -ODT) 4  mg, Oral, Every 6 hours PRN   Prenatal Vit-Fe Fumarate-FA (PRENATAL VITAMINS PO) Take by mouth.     Review of Systems:   Pertinent items are noted in HPI Denies abnormal vaginal discharge w/ itching/odor/irritation, headaches, visual changes, shortness of breath, chest pain, abdominal pain, severe nausea/vomiting, or problems with urination or bowel movements unless otherwise stated above. Pertinent History Reviewed:  Reviewed past medical,surgical, social, obstetrical and family history.  Reviewed problem list, medications and allergies. Physical Assessment:   Vitals:   04/01/24 1114 04/01/24 1130  BP: 135/80 122/76  Pulse: (!) 101   Weight: 176 lb 9.6 oz (80.1 kg)   Body mass index is 34.49 kg/m.           Physical Examination:   General appearance: alert, well appearing, and in no distress  Mental status: normal mood, behavior, speech, dress, motor activity, and thought processes  Skin: warm & dry   Extremities:   no edema   Cardiovascular: normal heart rate noted  Respiratory: normal respiratory effort, no distress  Abdomen: gravid, soft, non-tender  Pelvic: Cervical exam deferred         Fetal Status: Fetal Heart Rate (bpm): 130 Fundal Height: 33 cm Movement: Present  Fetal Surveillance Testing today: doppler    Chaperone: N/A    Results for orders placed or performed in visit on 04/01/24 (from the past 24 hours)  POCT glucose (manual entry)   Collection Time: 04/01/24 11:30 AM  Result Value Ref Range   POC Glucose 78 70 - 99 mg/dl     Assessment & Plan:  High-risk pregnancy: G2P1001 at [redacted]w[redacted]d with an Estimated Date of Delivery: 05/19/24   1. Gestational diabetes mellitus, class A2 -per pt well controlled -continue with current medication  2. Supervision of high risk pregnancy, antepartum (Primary) - Blood Pressure Monitoring (BLOOD PRESSURE CUFF) MISC; 1 kit by Does not apply route as needed.  Dispense: 1 each; Refill: 0  3. 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    Dispense:  1 each    Refill:  0    Z34.90  Please mail to patient    Labs/procedures today: BPP/dopplers with MFM  Treatment Plan:  routine OB care and as outlined above  Reviewed: Preterm labor symptoms and general obstetric precautions including but not limited to vaginal bleeding, contractions, leaking of fluid and fetal movement were reviewed in detail with the patient.  All questions were answered. Pt doe not have home bp cuff- new Rx sent in and encouraged to get one if/when possible. Check bp weekly, let us  know if >140/90.   Follow-up: Return for twice weekly as scheduled.   Future Appointments  Date Time Provider Department Center  04/05/2024  3:30 PM [facility] NURSE [facility]  04/08/2024  3:10 PM [facility] NURSE [facility]  04/08/2024  3:10 PM Marilynn Nest, DO [facility]  04/12/2024  3:30 PM [facility] NURSE [facility]  04/15/2024  3:10 PM [facility] NURSE [facility]  04/15/2024  3:10 PM Marilynn Nest, DO [facility]  04/19/2024  2:50 PM [facility] NURSE [facility]  04/22/2024  1:30 PM [facility] US  [facility] None  04/22/2024  2:30 PM Marilynn Nest, DO [facility]  04/27/2024  3:00 PM [facility] US  [facility] None  04/27/2024  4:10 PM Marilynn Nest, DO [facility]  05/03/2024  3:30 PM [facility] NURSE [facility]  05/06/2024  2:50 PM [facility] NURSE [facility]  05/06/2024  3:10 PM Marilynn Nest, DO [facility]  05/10/2024  3:30 PM [facility] NURSE [facility]  05/13/2024  3:10 PM [facility] NURSE [facility]  05/13/2024  3:10 PM Marilynn Nest, DO [facility]  05/17/2024  3:30 PM  [facility] NURSE [facility]    Orders Placed This Encounter  Procedures   POCT glucose (manual entry)    Nest Marilynn, DO Attending Obstetrician & Gynecologist, Faculty Practice Center for [employer], [facility]

## 2024-04-02 ENCOUNTER — Encounter: Payer: Self-pay | Admitting: Obstetrics & Gynecology

## 2024-04-05 ENCOUNTER — Ambulatory Visit (INDEPENDENT_AMBULATORY_CARE_PROVIDER_SITE_OTHER)

## 2024-04-05 VITALS — BP 134/81 | Wt 181.5 lb

## 2024-04-05 DIAGNOSIS — O24419 Gestational diabetes mellitus in pregnancy, unspecified control: Secondary | ICD-10-CM | POA: Diagnosis not present

## 2024-04-05 DIAGNOSIS — Z3A33 33 weeks gestation of pregnancy: Secondary | ICD-10-CM

## 2024-04-05 DIAGNOSIS — O24410 Gestational diabetes mellitus in pregnancy, diet controlled: Secondary | ICD-10-CM

## 2024-04-05 DIAGNOSIS — O0990 Supervision of high risk pregnancy, unspecified, unspecified trimester: Secondary | ICD-10-CM

## 2024-04-05 NOTE — Progress Notes (Signed)
   NURSE VISIT- NST  SUBJECTIVE:  Ann Hopkins is a 23 y.o. G57P1001 female at [redacted]w[redacted]d, here for a NST for pregnancy complicated by Diabetes: A2DM.  She reports active fetal movement, contractions: none, vaginal bleeding: none, membranes: intact.   OBJECTIVE:  BP 134/81   Wt 181 lb 8 oz (82.3 kg)   LMP 07/31/2023 (Exact Date)   BMI 35.45 kg/m   Appears well, no apparent distress  No results found for this or any previous visit (from the past 24 hours).  NST: FHR baseline 135 bpm, Variability: moderate, Accelerations:present, Decelerations:  Absent= Cat 1/reactive Toco: none   ASSESSMENT: G2P1001 at 104w5d with Diabetes: A2DM NST reactive  PLAN: EFM strip reviewed by Dr. Ozan   Recommendations: keep next appointment as scheduled    Demica Zook E Bertram Haddix  04/05/2024 3:42 PM

## 2024-04-06 ENCOUNTER — Encounter: Payer: Self-pay | Admitting: Obstetrics & Gynecology

## 2024-04-08 ENCOUNTER — Ambulatory Visit: Admitting: Obstetrics & Gynecology

## 2024-04-08 ENCOUNTER — Other Ambulatory Visit

## 2024-04-08 VITALS — BP 115/75 | HR 101 | Wt 181.0 lb

## 2024-04-08 DIAGNOSIS — Z8759 Personal history of other complications of pregnancy, childbirth and the puerperium: Secondary | ICD-10-CM

## 2024-04-08 DIAGNOSIS — O3483 Maternal care for other abnormalities of pelvic organs, third trimester: Secondary | ICD-10-CM | POA: Diagnosis not present

## 2024-04-08 DIAGNOSIS — O361930 Maternal care for other isoimmunization, third trimester, not applicable or unspecified: Secondary | ICD-10-CM | POA: Diagnosis not present

## 2024-04-08 DIAGNOSIS — Z3A34 34 weeks gestation of pregnancy: Secondary | ICD-10-CM | POA: Diagnosis not present

## 2024-04-08 DIAGNOSIS — O24419 Gestational diabetes mellitus in pregnancy, unspecified control: Secondary | ICD-10-CM

## 2024-04-08 DIAGNOSIS — O24414 Gestational diabetes mellitus in pregnancy, insulin controlled: Secondary | ICD-10-CM | POA: Diagnosis not present

## 2024-04-08 DIAGNOSIS — D271 Benign neoplasm of left ovary: Secondary | ICD-10-CM

## 2024-04-08 DIAGNOSIS — D369 Benign neoplasm, unspecified site: Secondary | ICD-10-CM

## 2024-04-08 DIAGNOSIS — O0990 Supervision of high risk pregnancy, unspecified, unspecified trimester: Secondary | ICD-10-CM

## 2024-04-08 DIAGNOSIS — Z7984 Long term (current) use of oral hypoglycemic drugs: Secondary | ICD-10-CM

## 2024-04-08 DIAGNOSIS — Z6791 Unspecified blood type, Rh negative: Secondary | ICD-10-CM

## 2024-04-08 LAB — GLUCOSE, POCT (MANUAL RESULT ENTRY): POC Glucose: 79 mg/dL (ref 70–99)

## 2024-04-08 NOTE — Progress Notes (Signed)
 HIGH-RISK PREGNANCY VISIT Patient name: Ann Hopkins MRN [id number]  Date of birth: Nov 30, 2000 Chief Complaint:   Routine Prenatal Visit and Non-stress Test  History of Present Illness:   Ann Hopkins is a 23 y.o. G42P1001 female at [redacted]w[redacted]d with an Estimated Date of Delivery: 05/19/24 being seen today for ongoing management of a high-risk pregnancy complicated by:  1) GDMA2 - Patient did not have log but notes are all within normal range - Currently on metformin 500 mg twice daily - Suspected LGA, last ultrasound 95th percentile  2) anti-Kell antibodies positive 3) left dermoid cyst 4) history of third-degree with postpartum hemorrhage  Today she reports no complaints.   Contractions: Not present. Vag. Bleeding: None.  Movement: Present. denies leaking of fluid.      03/17/2024    3:30 PM 03/02/2024    9:30 AM 11/10/2023    8:42 AM 08/26/2018    9:48 AM 12/08/2017    3:36 PM  Depression screen PHQ 2/9  Decreased Interest 0 0 2 0 0  Down, Depressed, Hopeless 0 0 0 0 0  PHQ - 2 Score 0 0 2 0 0  Altered sleeping  0 0 0 0  Tired, decreased energy  0 2 0 1  Change in appetite  0 2 0 1  Feeling bad or failure about yourself   0 0 0 0  Trouble concentrating  0 0 0 0  Moving slowly or fidgety/restless  0 0 0 0  Suicidal thoughts  0 0 0 0  PHQ-9 Score  0  6  0  2   Difficult doing work/chores    Not difficult at all      Data saved with a previous flowsheet row definition     Current Outpatient Medications  Medication Instructions   Accu-Chek Softclix Lancets lancets Use as instructed to check blood sugar 4 times daily   aspirin EC 81 mg, Oral, Daily, Swallow whole.   Blood Glucose Monitoring Suppl (ACCU-CHEK GUIDE ME) w/Device KIT 1 each, Does not apply, 4 times daily   Blood Pressure Monitoring (BLOOD PRESSURE CUFF) MISC Use as directed to check blood pressure daily   Blood Pressure Monitoring (BLOOD PRESSURE CUFF) MISC 1 kit, Does not apply, As needed   glucose blood test  strip Use as instructed to check blood sugar four times daily   metFORMIN (GLUCOPHAGE) 500 mg, Oral, 2 times daily with meals   ondansetron  (ZOFRAN -ODT) 4 mg, Oral, Every 6 hours PRN   Prenatal Vit-Fe Fumarate-FA (PRENATAL VITAMINS PO) Take by mouth.     Review of Systems:   Pertinent items are noted in HPI Denies abnormal vaginal discharge w/ itching/odor/irritation, headaches, visual changes, shortness of breath, chest pain, abdominal pain, severe nausea/vomiting, or problems with urination or bowel movements unless otherwise stated above. Pertinent History Reviewed:  Reviewed past medical,surgical, social, obstetrical and family history.  Reviewed problem list, medications and allergies. Physical Assessment:   Vitals:   04/08/24 1507  BP: 115/75  Pulse: (!) 101  Weight: 181 lb (82.1 kg)  Body mass index is 35.35 kg/m.           Physical Examination:   General appearance: alert, well appearing, and in no distress  Mental status: normal mood, behavior, speech, dress, motor activity, and thought processes  Skin: warm & dry   Extremities:   no edema   Cardiovascular: normal heart rate noted  Respiratory: normal respiratory effort, no distress  Abdomen: gravid, soft, non-tender  Pelvic: Cervical  exam deferred         Fetal Status:     Movement: Present    Fetal Surveillance Testing today: NST  NST being performed due to GDMA2   Fetal Monitoring:  Baseline: 130 bpm, Variability: moderate, Accelerations: present, The accelerations are >15 bpm and more than 2 in 20 minutes, and Decelerations: Absent     Final diagnosis:   Reactive NST   Chaperone: N/A    Results for orders placed or performed in visit on 04/08/24 (from the past 24 hours)  POCT glucose (manual entry)   Collection Time: 04/08/24  3:21 PM  Result Value Ref Range   POC Glucose 79 70 - 99 mg/dl     Assessment & Plan:  High-risk pregnancy: G2P1001 at [redacted]w[redacted]d with an Estimated Date of Delivery: 05/19/24    1) GDMA2 - Continue with current medicine - Reactive NST today, continue antepartum testing - Plan for growth scan as scheduled 11-20 - Pending fetal growth, consideration for IOL at 37 weeks due to LGA  2) anti-Kell antibodies positive 3) left dermoid cyst 4) history of third-degree with postpartum hemorrhage  Meds: No orders of the defined types were placed in this encounter.   Labs/procedures today: NST  Treatment Plan:  as aligned above and routine OB care  Reviewed: Preterm labor symptoms and general obstetric precautions including but not limited to vaginal bleeding, contractions, leaking of fluid and fetal movement were reviewed in detail with the patient.  All questions were answered. Pt has home bp cuff. Check bp weekly, let us  know if >140/90.   Follow-up: Return for twice weekly as scheduled.   Future Appointments  Date Time Provider Department Center  04/12/2024  3:30 PM [facility] NURSE [facility]  04/15/2024  3:10 PM [facility]  04/15/2024  3:10 PM Marilynn Nest, DO [facility]  04/19/2024  2:50 PM [facility]SE [facility]  04/22/2024  1:30 PM [facility] US  [facility] None  04/22/2024  2:30 PM Marilynn Nest, DO [facility]  04/27/2024  3:00 PM [facility] US  [facility] None  04/27/2024  4:10 PM Marilynn Nest, DO [facility]  05/03/2024  3:30 PM [facility]URSE [facility]  05/06/2024  2:50 PM [facility]  05/06/2024  3:10 PM Marilynn Nest, DO [facility]  05/10/2024  3:30 PM [facility]  05/13/2024  3:10 PM [facility]  05/13/2024  3:10 PM Marilynn Nest, DO [facility]  05/17/2024  3:30 PM [facility]    Orders Placed This Encounter  Procedures   POCT glucose (manual entry)    Nest Marilynn, DO Attending Obstetrician & Gynecologist, [facility] [employer], [facility]

## 2024-04-12 ENCOUNTER — Other Ambulatory Visit: Payer: Self-pay | Admitting: Maternal & Fetal Medicine

## 2024-04-12 ENCOUNTER — Ambulatory Visit (HOSPITAL_BASED_OUTPATIENT_CLINIC_OR_DEPARTMENT_OTHER): Admitting: Maternal & Fetal Medicine

## 2024-04-12 ENCOUNTER — Ambulatory Visit: Attending: Maternal & Fetal Medicine

## 2024-04-12 ENCOUNTER — Other Ambulatory Visit

## 2024-04-12 VITALS — BP 117/58 | HR 98

## 2024-04-12 DIAGNOSIS — O24419 Gestational diabetes mellitus in pregnancy, unspecified control: Secondary | ICD-10-CM | POA: Insufficient documentation

## 2024-04-12 DIAGNOSIS — O3483 Maternal care for other abnormalities of pelvic organs, third trimester: Secondary | ICD-10-CM

## 2024-04-12 DIAGNOSIS — O99213 Obesity complicating pregnancy, third trimester: Secondary | ICD-10-CM

## 2024-04-12 DIAGNOSIS — E669 Obesity, unspecified: Secondary | ICD-10-CM

## 2024-04-12 DIAGNOSIS — O361930 Maternal care for other isoimmunization, third trimester, not applicable or unspecified: Secondary | ICD-10-CM

## 2024-04-12 DIAGNOSIS — Z3A34 34 weeks gestation of pregnancy: Secondary | ICD-10-CM | POA: Insufficient documentation

## 2024-04-12 DIAGNOSIS — D279 Benign neoplasm of unspecified ovary: Secondary | ICD-10-CM

## 2024-04-12 NOTE — Progress Notes (Signed)
   Patient information  Patient Name: Ann Hopkins  Patient MRN:   [id number]  Referring practice: MFM Referring Provider: [facility]  Problem List   Patient Active Problem List   Diagnosis Date Noted   Family history of genetic disorder (father of the baby with son with Willian Romp) 03/05/2024   Gestational diabetes mellitus, class A2 03/03/2024   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   Dermoid cyst 11/10/2023   History of postpartum hemorrhage 11/10/2023   Supervision of high risk pregnancy, antepartum 11/07/2023   Rh negative state in antepartum period 12/10/2017    Maternal Fetal medicine Consult  [name] is a 23 y.o. G2P1001 at [redacted]w[redacted]d here for ultrasound and consultation. [name] is doing well today with no acute concerns. Today we focused on the following:   Anti-Kell antibodies: The fetal antigen status was negative for the Kell antigen.  Despite the very high titers the MCA Dopplers have been normal.  These results are concordant with the fetus not only not having anemia but also not having the Kell antigen.  MCA Dopplers are no longer needed due to the reassurance provided by the fetal antigen testing.  Gestational diabetes: Currently controlled with metformin.  Discussed this estimated fetal weight is at the 76 percentile and the [facility] is at the 97th percentile.  Delivery timing should occur around 37 to 38 weeks due to the history of a third-degree laceration and the patient's desire for a vaginal birth.  The patient had time to ask questions that were answered to her satisfaction.  She verbalized understanding and agrees to proceed with the plan below.  Recommendations - Continue weekly antenatal testing - MCA Dopplers and growth ultrasounds are no longer needed given the negative fetal antigen testing and the proximity to the delivery time  Review of Systems: A review of systems was performed and was negative except per HPI   Vitals and Physical Exam     04/12/2024    7:47 AM 04/08/2024    3:07 PM 04/05/2024    3:40 PM  Vitals with BMI  Weight  181 lbs 181 lbs 8 oz  Systolic 117 115 865  Diastolic 58 75 81  Pulse 98 101     Sitting comfortably on the sonogram table Nonlabored breathing Normal rate and rhythm Abdomen is nontender  Past pregnancies OB History  Gravida Para Term Preterm AB Living  2 1 1   1   SAB IAB Ectopic Multiple Live Births     0 1    # Outcome Date GA Lbr Len/2nd Weight Sex Type Anes PTL Lv  2 Current           1 Term 07/11/18 [redacted]w[redacted]d 487:05 / 00:30 8 lb 6.4 oz (3.81 kg) F Vag-Spont EPI  LIV     I spent 30 minutes reviewing the patients chart, including labs and images as well as counseling the patient about her medical conditions. Greater than 50% of the time was spent in direct face-to-face patient counseling.  [name]  MFM, [facility]   04/12/2024  9:45 AM

## 2024-04-12 NOTE — Progress Notes (Unsigned)
   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 G2P1001 at [redacted]w[redacted]d here for ultrasound and consultation. Ann Hopkins is doing well today with no acute concerns. Today we focused on the following:   GDMA1: The patient reports that approximately half of her blood sugars are at goal.  Her fasting blood sugar has been increasing to the high 90s more often in the low 100s as well.  Her postprandials are mostly less than 120 but when she is not at work walking around she noticed that her blood sugars are less well-controlled.  I also discussed that the ultrasound shows that there is excessive fetal weight at the 95th percentile.  This is a sign of poor control.  If her blood sugars are not at least 75% within goal then medication should be considered.  Alloimmunization:   The patient had time to ask questions that were answered to her satisfaction.  She verbalized understanding and agrees to proceed with the plan below.  Recommendations -Serial growth ultrasounds every 4-6 weeks until delivery -Antenatal testing to start around 32 weeks  -Delivery around *** weeks gestation  Review of Systems: A review of systems was performed and was negative except per HPI   Vitals and Physical Exam    04/12/2024    7:47 AM 04/08/2024    3:07 PM 04/05/2024    3:40 PM  Vitals with BMI  Weight   181 lbs 181 lbs 8 oz  Systolic 117 115 865  Diastolic 58 75 81  Pulse 98 101     Sitting comfortably on the sonogram table Nonlabored breathing Normal rate and rhythm Abdomen is nontender  Past pregnancies OB History  Gravida Para Term Preterm AB Living  2 1 1   1   SAB IAB Ectopic Multiple Live Births     0 1    # Outcome Date GA Lbr Len/2nd Weight Sex Type Anes PTL Lv  2 Current           1 Term 07/11/18 [redacted]w[redacted]d 487:05 / 00:30 8 lb 6.4 oz (3.81 kg) F Vag-Spont EPI  LIV     I spent *** minutes reviewing the patients chart, including labs and images as well as counseling the patient about her medical conditions. Greater than 50% of the time was spent in direct face-to-face patient counseling.  [name]  MFM, [facility]   04/12/2024  8:34 AM

## 2024-04-15 ENCOUNTER — Other Ambulatory Visit

## 2024-04-15 ENCOUNTER — Ambulatory Visit: Admitting: Obstetrics & Gynecology

## 2024-04-15 ENCOUNTER — Encounter: Payer: Self-pay | Admitting: Obstetrics & Gynecology

## 2024-04-15 VITALS — BP 122/71 | HR 82 | Wt 180.0 lb

## 2024-04-15 DIAGNOSIS — O24419 Gestational diabetes mellitus in pregnancy, unspecified control: Secondary | ICD-10-CM

## 2024-04-15 DIAGNOSIS — O0990 Supervision of high risk pregnancy, unspecified, unspecified trimester: Secondary | ICD-10-CM

## 2024-04-15 DIAGNOSIS — Z3A35 35 weeks gestation of pregnancy: Secondary | ICD-10-CM

## 2024-04-15 DIAGNOSIS — Z6791 Unspecified blood type, Rh negative: Secondary | ICD-10-CM

## 2024-04-15 DIAGNOSIS — O0993 Supervision of high risk pregnancy, unspecified, third trimester: Secondary | ICD-10-CM | POA: Diagnosis not present

## 2024-04-15 DIAGNOSIS — O361930 Maternal care for other isoimmunization, third trimester, not applicable or unspecified: Secondary | ICD-10-CM

## 2024-04-15 DIAGNOSIS — O26893 Other specified pregnancy related conditions, third trimester: Secondary | ICD-10-CM

## 2024-04-15 NOTE — Progress Notes (Signed)
 HIGH-RISK PREGNANCY VISIT Patient name: Ann Hopkins MRN [id number]  Date of birth: [birth year] Chief Complaint:   Routine Prenatal Visit  History of Present Illness:   Ann Hopkins is a 23 y.o. G27P1001 female at [redacted]w[redacted]d with an Estimated Date of Delivery: 05/19/24 being seen today for ongoing management of a high-risk pregnancy complicated by:  -GDMA2- well controlled with metformin Prior LGA- most recent US  improved 76%, AC 97% MFM recommend delivery @ 37-38wk due to prior 3rd degree and A2  -Anti-Kell antibodies- antigen testing negative, normal MCA dopplers  -L dermoid cyst -h/o 3rd deg/PPH  Today she reports no complaints.   Contractions: Not present. Vag. Bleeding: None.  Movement: Present. denies leaking of fluid.      03/17/2024    3:30 PM 03/02/2024    9:30 AM 11/10/2023    8:42 AM 08/26/2018    9:48 AM 12/08/2017    3:36 PM  Depression screen PHQ 2/9  Decreased Interest 0 0 2 0 0  Down, Depressed, Hopeless 0 0 0 0 0  PHQ - 2 Score 0 0 2 0 0  Altered sleeping  0 0 0 0  Tired, decreased energy  0 2 0 1  Change in appetite  0 2 0 1  Feeling bad or failure about yourself   0 0 0 0  Trouble concentrating  0 0 0 0  Moving slowly or fidgety/restless  0 0 0 0  Suicidal thoughts  0 0 0 0  PHQ-9 Score  0  6  0  2   Difficult doing work/chores    Not difficult at all      Data saved with a previous flowsheet row definition     Current Outpatient Medications  Medication Instructions   Accu-Chek Softclix Lancets lancets Use as instructed to check blood sugar 4 times daily   aspirin EC 81 mg, Oral, Daily, Swallow whole.   Blood Glucose Monitoring Suppl (ACCU-CHEK GUIDE ME) w/Device KIT 1 each, Does not apply, 4 times daily   Blood Pressure Monitoring (BLOOD PRESSURE CUFF) MISC Use as directed to check blood pressure daily   Blood Pressure Monitoring (BLOOD PRESSURE CUFF) MISC 1 kit, Does not apply, As needed   glucose blood test strip Use as instructed to check blood  sugar four times daily   metFORMIN (GLUCOPHAGE) 500 mg, Oral, 2 times daily with meals   ondansetron  (ZOFRAN -ODT) 4 mg, Oral, Every 6 hours PRN   Prenatal Vit-Fe Fumarate-FA (PRENATAL VITAMINS PO) Take by mouth.     Review of Systems:   Pertinent items are noted in HPI Denies abnormal vaginal discharge w/ itching/odor/irritation, headaches, visual changes, shortness of breath, chest pain, abdominal pain, severe nausea/vomiting, or problems with urination or bowel movements unless otherwise stated above. Pertinent History Reviewed:  Reviewed past medical,surgical, social, obstetrical and family history.  Reviewed problem list, medications and allergies. Physical Assessment:   Vitals:   04/15/24 1508  BP: 122/71  Pulse: 82  Weight: 180 lb (81.6 kg)  Body mass index is 35.15 kg/m.           Physical Examination:   General appearance: alert, well appearing, and in no distress  Mental status: normal mood, behavior, speech, dress, motor activity, and thought processes  Skin: warm & dry   Extremities:      Cardiovascular: normal heart rate noted  Respiratory: normal respiratory effort, no distress  Abdomen: gravid, soft, non-tender  Pelvic: Cervical exam deferred  Fetal Status:     Movement: Present    Fetal Surveillance Testing today: NST  NST being performed due to GDMA2   Fetal Monitoring:  Baseline: 130 bpm, Variability: moderate, Accelerations: present, The accelerations are >15 bpm and more than 2 in 20 minutes, and Decelerations: Absent     Final diagnosis:   Reactive NST      Chaperone: N/A    No results found for this or any previous visit (from the past 24 hours).   Assessment & Plan:  High-risk pregnancy: G2P1001 at 101w1d with an Estimated Date of Delivery: 05/19/24   -GDMA2- well controlled with metformin Prior LGA- most recent US  improved 76%, AC 97% Reactive NST, continue antepartum testing until delivery MFM recommend delivery @ 37-38wk due to  prior 3rd degree and A2  -Anti-Kell - antigen testing negative, normal MCA dopplers  -Left dermoid cyst  Meds: No orders of the defined types were placed in this encounter.   Labs/procedures today: NST  Treatment Plan:  as outlined above, IOL scheduled for 37wks  11/26 am []  will place orders once GBS completed next week  Reviewed: Preterm labor symptoms and general obstetric precautions including but not limited to vaginal bleeding, contractions, leaking of fluid and fetal movement were reviewed in detail with the patient.  All questions were answered. Pt has home bp cuff. Check bp weekly, let us  know if >140/90.   Follow-up: Return for twice weekly as scheduled in our office/IOL scheduled 11/26.   Future Appointments  Date Time Provider Department Center  04/19/2024  2:50 PM [facility] NURSE [facility]  04/22/2024  1:30 PM [facility] US  [facility] None  04/22/2024  2:30 PM Kyshaun Barnette, DO [facility]  04/27/2024  3:00 PM [facility] US  [facility] None  04/27/2024  4:10 PM Marilynn Nest, DO [facility]  04/28/2024  7:15 AM [facility] SCHED ROOM [facility] None    No orders of the defined types were placed in this encounter.   Carrah Eppolito, DO Attending Obstetrician & Gynecologist, [facility] [employer], [facility]

## 2024-04-16 ENCOUNTER — Telehealth (HOSPITAL_COMMUNITY): Payer: Self-pay | Admitting: *Deleted

## 2024-04-16 NOTE — Telephone Encounter (Signed)
 Preadmission screen

## 2024-04-19 ENCOUNTER — Encounter (HOSPITAL_COMMUNITY): Payer: Self-pay | Admitting: *Deleted

## 2024-04-19 ENCOUNTER — Inpatient Hospital Stay (HOSPITAL_COMMUNITY)
Admission: AD | Admit: 2024-04-19 | Discharge: 2024-04-19 | Disposition: A | Discharge status: Home / Self Care | Attending: Family Medicine | Admitting: Family Medicine

## 2024-04-19 ENCOUNTER — Telehealth (HOSPITAL_COMMUNITY): Payer: Self-pay | Admitting: *Deleted

## 2024-04-19 ENCOUNTER — Other Ambulatory Visit

## 2024-04-19 ENCOUNTER — Encounter: Payer: Self-pay | Admitting: Obstetrics & Gynecology

## 2024-04-19 ENCOUNTER — Encounter (HOSPITAL_COMMUNITY): Payer: Self-pay | Admitting: Obstetrics and Gynecology

## 2024-04-19 DIAGNOSIS — R109 Unspecified abdominal pain: Secondary | ICD-10-CM | POA: Diagnosis present

## 2024-04-19 DIAGNOSIS — Z3A35 35 weeks gestation of pregnancy: Secondary | ICD-10-CM | POA: Diagnosis not present

## 2024-04-19 DIAGNOSIS — O99891 Other specified diseases and conditions complicating pregnancy: Secondary | ICD-10-CM | POA: Diagnosis not present

## 2024-04-19 DIAGNOSIS — K5901 Slow transit constipation: Secondary | ICD-10-CM

## 2024-04-19 DIAGNOSIS — O24415 Gestational diabetes mellitus in pregnancy, controlled by oral hypoglycemic drugs: Secondary | ICD-10-CM | POA: Insufficient documentation

## 2024-04-19 DIAGNOSIS — R141 Gas pain: Secondary | ICD-10-CM | POA: Diagnosis not present

## 2024-04-19 DIAGNOSIS — Z3689 Encounter for other specified antenatal screening: Secondary | ICD-10-CM

## 2024-04-19 DIAGNOSIS — O0993 Supervision of high risk pregnancy, unspecified, third trimester: Secondary | ICD-10-CM | POA: Insufficient documentation

## 2024-04-19 DIAGNOSIS — O26893 Other specified pregnancy related conditions, third trimester: Secondary | ICD-10-CM

## 2024-04-19 LAB — URINALYSIS, ROUTINE W REFLEX MICROSCOPIC
Bilirubin Urine: NEGATIVE
Glucose, UA: NEGATIVE mg/dL
Hgb urine dipstick: NEGATIVE
Ketones, ur: NEGATIVE mg/dL
Nitrite: NEGATIVE
Protein, ur: NEGATIVE mg/dL
Specific Gravity, Urine: 1.015 (ref 1.005–1.030)
pH: 6.5 (ref 5.0–8.0)

## 2024-04-19 LAB — URINALYSIS, MICROSCOPIC (REFLEX)

## 2024-04-19 MED ORDER — POLYETHYLENE GLYCOL 3350 17 GM/SCOOP PO POWD
17.0000 g | Freq: Every day | ORAL | 1 refills | Status: AC
Start: 2024-04-19 — End: ?

## 2024-04-19 MED ORDER — SENNA 8.6 MG PO TABS: 1.0000 | ORAL_TABLET | Freq: Every day | ORAL | 0 refills | Status: DC | PRN

## 2024-04-19 NOTE — MAU Provider Note (Signed)
 History  Ann Hopkins is a 23 y.o. a G2P1001 at [redacted]w[redacted]d reporting to MAU for abdominal pain and mucous discharge.  Her current high-risk pregnancy is complicated by gDMA2 well-controlled with metformin. She has an OB history significant for PPH 2/2 third degree perineal laceration.   She receives prenatal care at [facility] and is scheduled for IOL on 11/26.   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 She reports vomiting due to the severity of the pain but is not currently nauseous. The pain improved mildly with back and forth rocking. She also noted mucinous vaginal discharge, but denies any clear fluid or blood. Currently, she endorses 4/10 constant cramping on the lower abdomen. She reports she has struggled with constipation during this pregnancy and her last BM was a week ago, but she is currently passing gas. She denies any other symptoms at this time.  She reports good fetal movement and denies headache, fever, chills, chest pain, SOB, nausea, vomiting or dysuria.   Abdominal Pain The pain is located in the suprapubic region. The pain is at a severity of 4/10. The quality of the pain is cramping. Associated symptoms include constipation. Pertinent negatives include no dysuria, fever, headaches, nausea or vomiting.    OB History     Gravida  2   Para  1    Term  1   Preterm      AB      Living  1      SAB      IAB      Ectopic      Multiple  0   Live Births  1           Past Medical History:  Diagnosis Date   Diabetes mellitus without complication (HCC)    Gestational diabetes    Leakage of amniotic fluid 02/17/2024   Medical history non-contributory    Seasonal allergies     Past Surgical History:  Procedure Laterality Date   childbirth     NO PAST SURGERIES      Family History  Problem Relation Age of Onset   Cancer Other    Cancer Maternal Grandfather    Diabetes Maternal Grandfather     Social History   Tobacco Use   Smoking status: Never    Passive exposure: Never   Smokeless tobacco: Never  Vaping Use   Vaping status: Never Used  Substance Use Topics   Alcohol use: No   Drug use: No    Allergies:  Allergies  Allergen Reactions   Latex Rash    Medications Prior to Admission  Medication Sig Dispense Refill Last Dose/Taking   Accu-Chek Softclix Lancets lancets Use as instructed to check blood sugar 4 times daily 100 each 12    aspirin EC 81 MG tablet Take 1 tablet (81 mg total) by mouth daily. Swallow whole. (Patient  not taking: Reported on 04/08/2024) 90 tablet 3    Blood Glucose Monitoring Suppl (ACCU-CHEK GUIDE ME) w/Device KIT 1 each by Does not apply route 4 (four) times daily. 1 kit 0    Blood Pressure Monitoring (BLOOD PRESSURE CUFF) MISC Use as directed to check blood pressure daily (Patient not taking: Reported on 04/08/2024) 1 each 0    Blood Pressure Monitoring (BLOOD PRESSURE CUFF) MISC 1 kit by Does not apply route as needed. 1 each 0    glucose blood test strip Use as instructed to check blood sugar four times daily 100 each 12    metFORMIN (GLUCOPHAGE) 500 MG tablet Take 1 tablet (500 mg total) by mouth 2 (two) times daily with a meal. 60 tablet 3    ondansetron  (ZOFRAN -ODT) 4 MG disintegrating tablet Take 1 tablet (4 mg total) by mouth every 6 (six) hours as needed for  nausea. (Patient not taking: Reported on 04/08/2024) 30 tablet 2    Prenatal Vit-Fe Fumarate-FA (PRENATAL VITAMINS PO) Take by mouth.       Review of Systems  Constitutional:  Negative for chills and fever.  Eyes:  Negative for blurred vision and double vision.  Respiratory:  Negative for shortness of breath.   Cardiovascular:  Negative for chest pain.  Gastrointestinal:  Positive for abdominal pain and constipation. Negative for nausea and vomiting.  Genitourinary:  Negative for dysuria.  Neurological:  Negative for dizziness and headaches.    See HPI Above Physical Exam   Blood pressure 122/64, pulse 96, temperature 98.3 F (36.8 C), temperature source Oral, resp. rate 16, height 5' (1.524 m), weight 81.1 kg, last menstrual period 07/31/2023, SpO2 99%.  Results for orders placed or performed during the hospital encounter of 04/19/24 (from the past 24 hours)  Urinalysis, Routine w reflex microscopic -Urine, Clean Catch     Status: Abnormal   Collection Time: 04/19/24  1:58 AM  Result Value Ref Range   Color, Urine YELLOW YELLOW   APPearance HAZY (A) CLEAR   Specific Gravity, Urine 1.015 1.005 - 1.030   pH 6.5 5.0 - 8.0   Glucose, UA NEGATIVE NEGATIVE mg/dL   Hgb urine dipstick NEGATIVE NEGATIVE   Bilirubin Urine NEGATIVE NEGATIVE   Ketones, ur NEGATIVE NEGATIVE mg/dL   Protein, ur NEGATIVE NEGATIVE mg/dL   Nitrite NEGATIVE NEGATIVE   Leukocytes,Ua SMALL (A) NEGATIVE  Urinalysis, Microscopic (reflex)     Status: Abnormal   Collection Time: 04/19/24  1:58 AM  Result Value Ref Range   RBC / HPF 0-5 0 - 5 RBC/hpf   WBC, UA 0-5 0 - 5 WBC/hpf   Bacteria, UA MANY (A) NONE SEEN   Squamous Epithelial / HPF 6-10 0 - 5 /HPF   Mucus PRESENT    Ca Oxalate Crys, UA PRESENT     Physical Exam Constitutional:      Appearance: She is well-developed.  Cardiovascular:     Rate and Rhythm: Normal rate and regular rhythm.     Heart sounds: Normal heart sounds.  Pulmonary:     Effort:  Pulmonary effort is normal.     Breath sounds: Normal breath sounds.  Abdominal:     Palpations: Abdomen is soft.     Tenderness: There is no abdominal tenderness.  Neurological:     Mental Status: She is alert.      FHR: 135 bpm, Mod Var, -Decels, +Accels UC: none seen on tracing, patient denies contractions at this time. ED Course  Assessment: Acute right upper quadrant  pain in a pregnant individual could be due to acute cholecystitis, biliary colic, nephrolithiasis, appendicitis, Braxton-Hicks contractions, pancreatitis, HELLP syndrome and round ligament pain. There is low concern for uterine rupture as the patient appears well and is hemodynamically stable. The absence of vaginal bleeding is reassuring against placental abruption. HELLP syndrome can present without hypertension and should be ruled out, but would likely not self-resolve. Nephrolithiasis can present with severe pain but would be unilateral and would have hematuria, not seen on UA. The presence of mucinous discharge raises suspicion for PPROM, although there was no clear fluid discharge reported. FHT are reassuring and do not show active contractions. The patient's history of constipation, colicky nature of the pain and spontaneous resolution in the absence of other symptoms makes gas pain the most likely etiology.  Plan: - Symptoms spontaneously resolved, patient refused further workup. - Discharge to home in stable condition with preterm labor precautions.    Nunzio DELENA Cena Verma Medical Student 04/19/2024 3:44 AM

## 2024-04-19 NOTE — Discharge Instructions (Addendum)
 Were seen in the maternity assessment unit for abdominal pain had improved by the time making presented here.  Your urine analysis did not show any signs of infection or signs of a kidney stone.  Given the nature of your pain that got better without any intervention except for Tums this is most likely related to constipation since he has not had a bowel movement in approximately a week.  I would recommend that you use a MiraLAX  cleanout to help improve your bowel movement  Below is a recipe that you can use for a MiraLAX  cleanout.  Expect a good amount of stools in the 24 hours that you are doing this procedure.  After that I recommend starting a daily fiber movement or trying to get at least 20 g of fiber per day.  You can also use MiraLAX  1 capful every day to help prevent constipation.  Miralax  Clean out Take 8 capfuls of miralax  in 64 oz of gatorade. You can use any fluid that appeals to you (gatorade, water, juice) Continue to drink at least eight 8 oz glasses of water throughout the day You can repeat with another 8 capfuls of miralax  in 64 oz of gatorade if you are not having a large amount of stools You will need to be at home and close to a bathroom for about 8 hours when you do the above as you may need to go to the bathroom frequently.   After you are cleaned out: - Start Colace100mg  twice daily - Start Miralax  once daily - Start a daily fiber supplement like metamucil or citrucel - You can safely use enemas in pregnancy  - if you are having diarrhea you can reduce to Colace once a day or miralax  every other day or a 1/2 capful daily.

## 2024-04-19 NOTE — MAU Note (Signed)
 MAU Triage Note: Ann Hopkins is a 23 y.o. at [redacted]w[redacted]d here in MAU reporting: started having tightening pain in her abdomen that was constant and lasted for about one hour before turning into intermittent abdominal cramping. The constant pain occurred about 1.5 hours ago. Denies watery LOF or VB - does report mucous discharge. Reports +FM.  Patient complaint: abdominal pain and mucous d/c  Pain Score: 6  Pain Location: Abdomen     Onset of complaint: today LMP: Patient's last menstrual period was 07/31/2023 (exact date).  Vitals:   04/19/24 0216  BP: 122/64  Pulse: 96  Resp: 16  SpO2: 99%    FHT:  Fetal Heart Rate Mode: External Baseline Rate (A): 130 bpm Lab orders placed from triage: UA

## 2024-04-19 NOTE — Telephone Encounter (Signed)
 Preadmission screen

## 2024-04-21 ENCOUNTER — Other Ambulatory Visit: Payer: Self-pay | Admitting: Obstetrics & Gynecology

## 2024-04-21 DIAGNOSIS — O24419 Gestational diabetes mellitus in pregnancy, unspecified control: Secondary | ICD-10-CM

## 2024-04-22 ENCOUNTER — Ambulatory Visit: Admitting: Obstetrics & Gynecology

## 2024-04-22 ENCOUNTER — Encounter: Payer: Self-pay | Admitting: Obstetrics & Gynecology

## 2024-04-22 ENCOUNTER — Ambulatory Visit (INDEPENDENT_AMBULATORY_CARE_PROVIDER_SITE_OTHER)

## 2024-04-22 ENCOUNTER — Other Ambulatory Visit (HOSPITAL_COMMUNITY)
Admission: RE | Admit: 2024-04-22 | Discharge: 2024-04-22 | Disposition: A | Discharge status: Home / Self Care | Source: Ambulatory Visit | Attending: Obstetrics & Gynecology | Admitting: Obstetrics & Gynecology

## 2024-04-22 VITALS — BP 125/80 | HR 105 | Wt 178.4 lb

## 2024-04-22 DIAGNOSIS — O24419 Gestational diabetes mellitus in pregnancy, unspecified control: Secondary | ICD-10-CM | POA: Diagnosis not present

## 2024-04-22 DIAGNOSIS — O24414 Gestational diabetes mellitus in pregnancy, insulin controlled: Secondary | ICD-10-CM

## 2024-04-22 DIAGNOSIS — Z3A36 36 weeks gestation of pregnancy: Secondary | ICD-10-CM

## 2024-04-22 DIAGNOSIS — Z148 Genetic carrier of other disease: Secondary | ICD-10-CM | POA: Diagnosis not present

## 2024-04-22 DIAGNOSIS — O26899 Other specified pregnancy related conditions, unspecified trimester: Secondary | ICD-10-CM

## 2024-04-22 DIAGNOSIS — O0993 Supervision of high risk pregnancy, unspecified, third trimester: Secondary | ICD-10-CM

## 2024-04-22 DIAGNOSIS — O26893 Other specified pregnancy related conditions, third trimester: Secondary | ICD-10-CM | POA: Diagnosis not present

## 2024-04-22 DIAGNOSIS — O361930 Maternal care for other isoimmunization, third trimester, not applicable or unspecified: Secondary | ICD-10-CM

## 2024-04-22 DIAGNOSIS — Z6791 Unspecified blood type, Rh negative: Secondary | ICD-10-CM

## 2024-04-22 NOTE — Progress Notes (Addendum)
 US  36+1 wks,cephalic,anterior placenta gr 2,AFI 16 cm,FHR 134 bpm,2 cm dermoid left ovary,normal right ovary,BPP 8/8

## 2024-04-22 NOTE — Progress Notes (Signed)
 HIGH-RISK PREGNANCY VISIT Patient name: Ann Hopkins MRN [id number]  Date of birth: 06/01/2001 Chief Complaint:   Routine Prenatal Visit  History of Present Illness:   Ann Hopkins is a 23 y.o. 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   Contractions: Not present. Vag. Bleeding: None.  Movement: Present. denies leaking of fluid.      03/17/2024    3:30 PM 03/02/2024    9:30 AM 11/10/2023    8:42 AM 08/26/2018    9:48 AM 12/08/2017    3:36 PM  Depression screen PHQ 2/9  Decreased Interest 0 0 2 0 0  Down, Depressed, Hopeless 0 0 0 0 0  PHQ - 2 Score 0 0 2 0 0  Altered sleeping  0 0 0 0  Tired, decreased energy  0 2 0 1  Change in appetite  0 2 0 1  Feeling bad or failure about yourself   0 0 0 0  Trouble concentrating  0 0 0 0  Moving slowly or fidgety/restless  0 0 0 0  Suicidal thoughts  0 0 0 0  PHQ-9 Score  0  6  0  2   Difficult doing work/chores    Not difficult at all      Data saved with a previous flowsheet row definition     Current Outpatient Medications  Medication Instructions   Accu-Chek Softclix Lancets lancets Use as instructed to check blood sugar 4 times daily   aspirin EC 81 mg, Oral, Daily, Swallow whole.   Blood Glucose Monitoring Suppl (ACCU-CHEK GUIDE ME) w/Device KIT 1 each, Does not apply, 4 times daily   Blood Pressure Monitoring (BLOOD PRESSURE CUFF) MISC Use as directed to check blood pressure daily   Blood Pressure Monitoring (BLOOD PRESSURE CUFF) MISC 1 kit, Does not apply, As needed   glucose blood test strip Use as instructed to check  blood sugar four times daily   metFORMIN (GLUCOPHAGE) 500 mg, Oral, 2 times daily with meals   ondansetron  (ZOFRAN -ODT) 4 mg, Oral, Every 6 hours PRN   polyethylene glycol powder (GLYCOLAX /MIRALAX ) 17 g, Oral, Daily, Follow directions for Miralax  clean out on AVS. After this use miralax  daily--Dissolve 1 capful (17g) in 4-8 ounces of liquid and take by mouth daily.   Prenatal Vit-Fe Fumarate-FA (PRENATAL VITAMINS PO) Take by mouth.   senna (SENOKOT) 8.6 mg, Oral, Daily PRN     Review of Systems:   Pertinent items are noted in HPI Denies abnormal vaginal discharge w/ itching/odor/irritation, headaches, visual changes, shortness of breath, chest pain, abdominal pain, severe nausea/vomiting, or problems with urination or bowel movements unless otherwise stated above. Pertinent History Reviewed:  Reviewed past medical,surgical, social, obstetrical and family history.  Reviewed problem list, medications and allergies. Physical Assessment:   Vitals:   04/22/24 1413  BP: 125/80  Pulse: (!) 105  Weight: 178 lb 6.4 oz (80.9 kg)  Body mass index is 34.84 kg/m.           Physical Examination:   General appearance: alert, well appearing, and in no distress  Mental status: normal mood, behavior, speech,  dress, motor activity, and thought processes  Skin: warm & dry   Extremities:      Cardiovascular: normal heart rate noted  Respiratory: normal respiratory effort, no distress  Abdomen: gravid, soft, non-tender  Pelvic: Cervical exam performed  Dilation: 1 Effacement (%): Thick Station: -3 Vaginal swabs obtained  Fetal Status:     Movement: Present    Fetal Surveillance Testing today: cephalic,anterior placenta gr 2,AFI 16 cm,FHR 134 bpm,2 cm dermoid left ovary,normal right ovary,BPP 8/8    Chaperone: Aleck Blase    No results found for this or any previous visit (from the past 24 hours).   Assessment & Plan:  High-risk pregnancy: G2P1001 at [redacted]w[redacted]d with an Estimated Date of  Delivery: 05/19/24   1. [redacted] weeks gestation of pregnancy  - Cervicovaginal ancillary only - Culture, beta strep (group b only)  2. Encounter for supervision of high risk pregnancy in third trimester, antepartum (Primary)   3. 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 Rh negative state in antepartum period S/p RhoGAM  5. 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   Labs/procedures today: BPP, GCC, GBS  Treatment Plan: Routine OB care and as outlined above  Reviewed: Preterm labor symptoms and general obstetric precautions including but not limited to vaginal bleeding, contractions, leaking of fluid and fetal movement were reviewed in detail with the patient.  All questions were answered.   Follow-up: Return for twice weekly as scheduled, IOL 11/26.   Future Appointments  Date Time Provider Department Center  04/27/2024  3:00 PM [facility]  [facility] None  04/27/2024  4:10 PM Marilynn Nest, DO [facility]  04/28/2024  7:15 AM [facility] SCHED ROOM [facility] None    Orders Placed This Encounter  Procedures   Culture, beta strep (group b only)   POCT glucose (manual entry)    Evolett Somarriba, DO Attending Obstetrician & Gynecologist, [facility] [employer], [facility]

## 2024-04-23 ENCOUNTER — Ambulatory Visit
Admission: RE | Admit: 2024-04-23 | Discharge: 2024-04-23 | Disposition: A | Discharge status: Home / Self Care | Source: Ambulatory Visit | Attending: Nurse Practitioner | Admitting: Nurse Practitioner

## 2024-04-23 VITALS — BP 123/75 | HR 101 | Temp 98.1°F | Resp 20

## 2024-04-23 DIAGNOSIS — H60392 Other infective otitis externa, left ear: Secondary | ICD-10-CM

## 2024-04-23 DIAGNOSIS — H66002 Acute suppurative otitis media without spontaneous rupture of ear drum, left ear: Secondary | ICD-10-CM | POA: Diagnosis not present

## 2024-04-23 MED ORDER — AMOXICILLIN 875 MG PO TABS: 875.0000 mg | ORAL_TABLET | Freq: Two times a day (BID) | ORAL | 0 refills | Status: DC

## 2024-04-23 MED ORDER — OFLOXACIN 0.3 % OT SOLN: 10.0000 [drp] | Freq: Every day | OTIC | 0 refills | Status: DC

## 2024-04-23 NOTE — Discharge Instructions (Signed)
 You have an ear infection behind your ear drum and in your left outer ear.  Use the ear drops and take the Amoxicillin  as prescribed to treat it.  Seek care if symptoms persist/worsen despite treatment.

## 2024-04-23 NOTE — ED Provider Notes (Signed)
 [facility]    CSN: [id number] Arrival date & time: 04/23/24  1311      History   Chief Complaint Chief Complaint  Patient presents with   Ear Injury    Ear pain - Entered by patient    HPI Ann Hopkins is a 23 y.o. female.   Patient presents today with 3-day history of left ear pain.  She denies fever, cough, congestion, sore throat.  Reports hearing is muffled out of the left ear.  No ear drainage.  Denies recent underwater swimming.  She is currently [redacted] weeks pregnant and is being induced next week for gestational diabetes.  Has not taken anything for the ear pain so far.    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 Authorizing Provider  amoxicillin  (AMOXIL ) 875 MG tablet Take 1 tablet (875 mg total) by mouth 2 (two) times daily  for 7 days. 04/23/24 04/30/24 Yes Chandra Harlene LABOR, NP  ofloxacin  (FLOXIN ) 0.3 % OTIC solution Place 10 drops into the left ear daily for 7 days. 04/23/24 04/30/24 Yes Chandra Harlene LABOR, NP  Accu-Chek Softclix Lancets lancets Use as instructed to check blood sugar 4 times daily 03/03/24   Kizzie Suzen SAUNDERS, CNM  aspirin  EC 81 MG tablet Take 1 tablet (81 mg total) by mouth daily. Swallow whole. Patient not taking: Reported on 04/22/2024 03/03/24   Kizzie Suzen SAUNDERS, CNM  Blood Glucose Monitoring Suppl (ACCU-CHEK GUIDE ME) w/Device KIT 1 each by Does not apply route 4 (four) times daily. 03/03/24   Kizzie Suzen SAUNDERS, CNM  Blood Pressure Monitoring (BLOOD PRESSURE CUFF) MISC Use as directed to check blood pressure daily Patient not taking: Reported on 04/22/2024 03/26/24   Kizzie Suzen SAUNDERS, CNM  Blood Pressure Monitoring (BLOOD PRESSURE CUFF) MISC 1 kit by Does not apply route as needed. 04/01/24   Ozan, Jennifer, DO  glucose blood test strip Use as instructed to check blood sugar four times daily 03/03/24   Kizzie Suzen SAUNDERS, CNM  metFORMIN  (  GLUCOPHAGE ) 500 MG tablet Take 1 tablet (500 mg total) by mouth 2 (two) times daily with a meal. 03/25/24   Kizzie Suzen SAUNDERS, CNM  ondansetron  (ZOFRAN -ODT) 4 MG disintegrating tablet Take 1 tablet (4 mg total) by mouth every 6 (six) hours as needed for nausea. Patient not taking: Reported on 04/08/2024 01/02/24   Ozan, Jennifer, DO  polyethylene glycol powder (GLYCOLAX /MIRALAX ) 17 GM/SCOOP powder Take 17 g by mouth daily. Follow directions for Miralax  clean out on AVS. After this use miralax  daily--Dissolve 1 capful (17g) in 4-8 ounces of liquid and take by mouth daily. Patient not taking: Reported on 04/22/2024 04/19/24   Eldonna Suzen Octave, MD  Prenatal Vit-Fe Fumarate-FA (PRENATAL VITAMINS PO) Take by mouth.    [provider]  senna (SENOKOT) 8.6 MG TABS tablet Take 1 tablet (8.6 mg total) by mouth daily as needed for mild  constipation. Patient not taking: Reported on 04/22/2024 04/19/24   Eldonna Suzen Octave, MD    Family History Family History  Problem Relation Age of Onset   Cancer Other    Cancer Maternal Grandfather    Diabetes Maternal Grandfather     Social History Social History   Tobacco Use   Smoking status: Never    Passive exposure: Never   Smokeless tobacco: Never  Vaping Use   Vaping status: Never Used  Substance Use Topics   Alcohol use: No   Drug use: No     Allergies   Latex   Review of Systems Review of Systems Per HPI  Physical Exam Triage Vital Signs ED Triage Vitals  Encounter Vitals Group     BP 04/23/24 1323 123/75     Girls Systolic BP Percentile --      Girls Diastolic BP Percentile --      Boys Systolic BP Percentile --      Boys Diastolic BP Percentile --      Pulse Rate 04/23/24 1323 (!) 101     Resp 04/23/24 1323 20     Temp 04/23/24 1323 98.1 F (36.7 C)     Temp Source 04/23/24 1323 Oral     SpO2 04/23/24 1323 96 %     Weight --      Height --      Head Circumference --      Peak Flow --      Pain Score 04/23/24 1322 6     Pain Loc --      Pain Education --      Exclude from Growth Chart --    No data found.  Updated Vital Signs BP 123/75 (BP Location: Right Arm)   Pulse (!) 101   Temp 98.1 F (36.7 C) (Oral)   Resp 20   LMP 07/31/2023 (Exact Date)   SpO2 96%   Visual Acuity Right Eye Distance:   Left Eye Distance:   Bilateral Distance:    Right Eye Near:   Left Eye Near:    Bilateral Near:     Physical Exam Vitals and nursing note reviewed.  Constitutional:      General: She is not in acute distress.    Appearance: Normal appearance. She is not toxic-appearing.  HENT:     Head: Normocephalic and atraumatic.     Right Ear: Tympanic membrane, ear canal and external ear normal.     Left Ear: Decreased hearing noted. Swelling and tenderness present. A middle ear effusion is present. Tympanic membrane is erythematous.      Nose: Nose normal.  No congestion or rhinorrhea.     Mouth/Throat:     Mouth: Mucous membranes are moist.     Pharynx: Oropharynx is clear. No posterior oropharyngeal erythema.  Cardiovascular:     Rate and Rhythm: Normal rate.  Pulmonary:     Effort: Pulmonary effort is normal. No respiratory distress.  Musculoskeletal:     Cervical back: Normal range of motion.  Lymphadenopathy:     Cervical: No cervical adenopathy.  Skin:    General: Skin is warm and dry.     Coloration: Skin is not jaundiced or pale.     Findings: No erythema.  Neurological:     Mental Status: She is alert and oriented to person, place, and time.  Psychiatric:        Behavior: Behavior is cooperative.      UC Treatments / Results  Labs (all labs ordered are listed, but only abnormal results are displayed) Labs Reviewed - No data to display  EKG   Radiology No results found.  Procedures Procedures (including critical care time)  Medications Ordered in UC Medications - No data to display  Initial Impression / Assessment and Plan / UC Course  I have reviewed the triage vital signs and the nursing notes.  Pertinent labs & imaging results that were available during my care of the patient were reviewed by me and considered in my medical decision making (see chart for details).   Vital signs are stable and patient is well-appearing; appears gravid.  On exam, there is otitis externa of the left ear as well as otitis media.  Treat with ofloxacin  eardrops daily for 10 days as well as amoxicillin  and or 75 mg twice daily for 5 days.  Supportive care discussed - can take Tylenol  as needed for pain.  Return and ER precautions discussed with patient.  The patient was given the opportunity to ask questions.  All questions answered to their satisfaction.  The patient is in agreement to this plan.   Final Clinical Impressions(s) / UC Diagnoses   Final diagnoses:  Non-recurrent acute suppurative otitis media  of left ear without spontaneous rupture of tympanic membrane  Infective otitis externa of left ear     Discharge Instructions      You have an ear infection behind your ear drum and in your left outer ear.  Use the ear drops and take the Amoxicillin  as prescribed to treat it.  Seek care if symptoms persist/worsen despite treatment.    ED Prescriptions     Medication Sig Dispense Auth. Provider   ofloxacin  (FLOXIN ) 0.3 % OTIC solution Place 10 drops into the left ear daily for 7 days. 5 mL Chandra Raisin A, NP   amoxicillin  (AMOXIL ) 875 MG tablet Take 1 tablet (875 mg total) by mouth 2 (two) times daily for 7 days. 14 tablet Chandra Raisin LABOR, NP      PDMP not reviewed this encounter.   Chandra Raisin LABOR, NP 04/23/24 1503

## 2024-04-23 NOTE — ED Triage Notes (Signed)
 Pt reports she has left ear pain x 3 days

## 2024-04-26 ENCOUNTER — Ambulatory Visit: Payer: Self-pay | Admitting: Obstetrics & Gynecology

## 2024-04-26 ENCOUNTER — Other Ambulatory Visit: Payer: Self-pay | Admitting: Obstetrics & Gynecology

## 2024-04-26 DIAGNOSIS — O24419 Gestational diabetes mellitus in pregnancy, unspecified control: Secondary | ICD-10-CM

## 2024-04-26 LAB — CERVICOVAGINAL ANCILLARY ONLY
Chlamydia: NEGATIVE
Comment: NEGATIVE
Comment: NORMAL
Neisseria Gonorrhea: NEGATIVE

## 2024-04-26 LAB — CULTURE, BETA STREP (GROUP B ONLY)

## 2024-04-27 ENCOUNTER — Ambulatory Visit (INDEPENDENT_AMBULATORY_CARE_PROVIDER_SITE_OTHER)

## 2024-04-27 ENCOUNTER — Ambulatory Visit: Admitting: Obstetrics & Gynecology

## 2024-04-27 ENCOUNTER — Other Ambulatory Visit: Payer: Self-pay | Admitting: Obstetrics and Gynecology

## 2024-04-27 ENCOUNTER — Encounter: Payer: Self-pay | Admitting: Obstetrics & Gynecology

## 2024-04-27 VITALS — BP 123/75 | HR 94 | Wt 180.0 lb

## 2024-04-27 DIAGNOSIS — Z3A36 36 weeks gestation of pregnancy: Secondary | ICD-10-CM

## 2024-04-27 DIAGNOSIS — O26899 Other specified pregnancy related conditions, unspecified trimester: Secondary | ICD-10-CM

## 2024-04-27 DIAGNOSIS — O24415 Gestational diabetes mellitus in pregnancy, controlled by oral hypoglycemic drugs: Secondary | ICD-10-CM

## 2024-04-27 DIAGNOSIS — O24419 Gestational diabetes mellitus in pregnancy, unspecified control: Secondary | ICD-10-CM

## 2024-04-27 DIAGNOSIS — O3483 Maternal care for other abnormalities of pelvic organs, third trimester: Secondary | ICD-10-CM

## 2024-04-27 DIAGNOSIS — Z6791 Unspecified blood type, Rh negative: Secondary | ICD-10-CM

## 2024-04-27 DIAGNOSIS — R7689 Other specified abnormal immunological findings in serum: Secondary | ICD-10-CM

## 2024-04-27 DIAGNOSIS — Z79891 Long term (current) use of opiate analgesic: Secondary | ICD-10-CM

## 2024-04-27 DIAGNOSIS — D271 Benign neoplasm of left ovary: Secondary | ICD-10-CM

## 2024-04-27 DIAGNOSIS — O99891 Other specified diseases and conditions complicating pregnancy: Secondary | ICD-10-CM

## 2024-04-27 DIAGNOSIS — O0993 Supervision of high risk pregnancy, unspecified, third trimester: Secondary | ICD-10-CM

## 2024-04-27 NOTE — Progress Notes (Signed)
 HIGH-RISK PREGNANCY VISIT Patient name: Ann Hopkins MRN [id number]  Date of birth: 2001/05/07 Chief Complaint:   Routine Prenatal Visit  History of Present Illness:   Ann Hopkins is a 23 y.o. 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   Contractions: Not present. Vag. Bleeding: None.  Movement: Present. denies leaking of fluid.      03/17/2024    3:30 PM 03/02/2024    9:30 AM 11/10/2023    8:42 AM 08/26/2018    9:48 AM 12/08/2017    3:36 PM  Depression screen PHQ 2/9  Decreased Interest 0 0 2 0 0  Down, Depressed, Hopeless 0 0 0 0 0  PHQ - 2 Score 0 0 2 0 0  Altered sleeping  0 0 0 0  Tired, decreased energy  0 2 0 1  Change in appetite  0 2 0 1  Feeling bad or failure about yourself   0 0 0 0  Trouble concentrating  0 0 0 0  Moving slowly or fidgety/restless  0 0 0 0  Suicidal thoughts  0 0 0 0  PHQ-9 Score  0  6  0  2   Difficult doing work/chores    Not difficult at all      Data saved with a previous flowsheet row definition     Current Outpatient Medications  Medication Instructions   Accu-Chek Softclix Lancets lancets Use as instructed to check blood sugar 4 times daily   amoxicillin  (AMOXIL ) 875 mg, Oral, 2 times daily   aspirin  EC 81 mg, Oral, Daily, Swallow whole.   Blood Glucose Monitoring Suppl (ACCU-CHEK GUIDE ME) w/Device KIT 1 each, Does not apply, 4 times daily   Blood Pressure Monitoring (BLOOD PRESSURE CUFF) MISC Use as directed to check blood pressure daily   Blood Pressure Monitoring (BLOOD PRESSURE CUFF) MISC 1 kit, Does not apply, As needed   glucose blood test strip Use as instructed to check blood sugar four times daily   metFORMIN   (GLUCOPHAGE ) 500 mg, Oral, 2 times daily with meals   ofloxacin  (FLOXIN ) 0.3 % OTIC solution 10 drops, Left EAR, Daily   ondansetron  (ZOFRAN -ODT) 4 mg, Oral, Every 6 hours PRN   polyethylene glycol powder (GLYCOLAX /MIRALAX ) 17 g, Oral, Daily, Follow directions for Miralax  clean out on AVS. After this use miralax  daily--Dissolve 1 capful (17g) in 4-8 ounces of liquid and take by mouth daily.   Prenatal Vit-Fe Fumarate-FA (PRENATAL VITAMINS PO) Take by mouth.   senna (SENOKOT) 8.6 mg, Oral, Daily PRN     Review of Systems:   Pertinent items are noted in HPI Denies abnormal vaginal discharge w/ itching/odor/irritation, headaches, visual changes, shortness of breath, chest pain, abdominal pain, severe nausea/vomiting, or problems with urination or bowel movements unless otherwise stated above. Pertinent History Reviewed:  Reviewed past medical,surgical, social, obstetrical and family history.  Reviewed problem list, medications and allergies. Physical Assessment:   Vitals:   04/27/24 1528  BP: 123/75  Pulse: 94  Weight: 180 lb (81.6 kg)  Body mass index is 35.15 kg/m.           Physical Examination:   General appearance: alert, well appearing, and in no distress  Mental status: normal mood, behavior, speech, dress, motor activity, and thought processes  Skin: warm & dry   Extremities:      Cardiovascular: normal heart rate noted  Respiratory: normal respiratory effort, no distress  Abdomen: gravid, soft, non-tender  Pelvic: Cervical exam deferred         Fetal Status:     Movement: Present    Fetal Surveillance Testing today: cephalic,BPP 8/8,FHR 135 bpm,anterior placenta gr 3,AFI 21 cm    Chaperone: N/A    No results found for this or any previous visit (from the past 24 hours).   Assessment & Plan:  High-risk pregnancy: G2P1001 at [redacted]w[redacted]d with an Estimated Date of Delivery: 05/19/24   1) GDMA2 -BPP 8/8 -IOL tmr morning- MFM recommend delivery @ 37-38wk due to prior 3rd  degree and A2   2) Rh neg 3) Anti-Kell Ab -fetal antigen negative  4) h/o PPH with 3rd degree Last US - Last US - 11/10- 2765g (76%)  5) Left dermoid  Meds: No orders of the defined types were placed in this encounter.   Labs/procedures today: BPP  Treatment Plan:  as outlined above  Reviewed: Term labor symptoms and general obstetric precautions including but not limited to vaginal bleeding, contractions, leaking of fluid and fetal movement were reviewed in detail with the patient.  All questions were answered.   Follow-up: Return for IOL scheduled.   Future Appointments  Date Time Provider Department Center  04/28/2024  7:15 AM [facility] SCHED ROOM [facility] None    No orders of the defined types were placed in this encounter.   Emaleigh Guimond, DO Attending Obstetrician & Gynecologist, [facility] for [employer], [facility]

## 2024-04-27 NOTE — Progress Notes (Signed)
 US  36+6 wks,cephalic,BPP 8/8,FHR 135 bpm,anterior placenta gr 3,AFI 21 cm

## 2024-04-28 ENCOUNTER — Encounter (HOSPITAL_COMMUNITY): Payer: Self-pay | Admitting: Obstetrics & Gynecology

## 2024-04-28 ENCOUNTER — Other Ambulatory Visit: Payer: Self-pay

## 2024-04-28 ENCOUNTER — Inpatient Hospital Stay (HOSPITAL_COMMUNITY)
Admission: RE | Admit: 2024-04-28 | Discharge: 2024-04-30 | DRG: 807 | Disposition: A | Discharge status: Home / Self Care | Attending: Obstetrics and Gynecology | Admitting: Obstetrics and Gynecology

## 2024-04-28 ENCOUNTER — Inpatient Hospital Stay (HOSPITAL_COMMUNITY)

## 2024-04-28 DIAGNOSIS — O26899 Other specified pregnancy related conditions, unspecified trimester: Secondary | ICD-10-CM

## 2024-04-28 DIAGNOSIS — Z3A37 37 weeks gestation of pregnancy: Secondary | ICD-10-CM | POA: Diagnosis not present

## 2024-04-28 DIAGNOSIS — Z833 Family history of diabetes mellitus: Secondary | ICD-10-CM | POA: Diagnosis not present

## 2024-04-28 DIAGNOSIS — Z6791 Unspecified blood type, Rh negative: Secondary | ICD-10-CM | POA: Diagnosis not present

## 2024-04-28 DIAGNOSIS — O24425 Gestational diabetes mellitus in childbirth, controlled by oral hypoglycemic drugs: Principal | ICD-10-CM | POA: Diagnosis present

## 2024-04-28 DIAGNOSIS — Z8759 Personal history of other complications of pregnancy, childbirth and the puerperium: Secondary | ICD-10-CM

## 2024-04-28 DIAGNOSIS — O26893 Other specified pregnancy related conditions, third trimester: Secondary | ICD-10-CM | POA: Diagnosis present

## 2024-04-28 DIAGNOSIS — O24419 Gestational diabetes mellitus in pregnancy, unspecified control: Secondary | ICD-10-CM

## 2024-04-28 DIAGNOSIS — Z8632 Personal history of gestational diabetes: Secondary | ICD-10-CM

## 2024-04-28 LAB — COMPREHENSIVE METABOLIC PANEL WITH GFR
ALT: 14 U/L (ref 0–44)
AST: 20 U/L (ref 15–41)
Albumin: 2.9 g/dL — ABNORMAL LOW (ref 3.5–5.0)
Alkaline Phosphatase: 104 U/L (ref 38–126)
Anion gap: 10 (ref 5–15)
BUN: 6 mg/dL (ref 6–20)
CO2: 19 mmol/L — ABNORMAL LOW (ref 22–32)
Calcium: 8.8 mg/dL — ABNORMAL LOW (ref 8.9–10.3)
Chloride: 107 mmol/L (ref 98–111)
Creatinine, Ser: 0.46 mg/dL (ref 0.44–1.00)
GFR, Estimated: >60 mL/min (ref >60)
Glucose, Bld: 112 mg/dL — ABNORMAL HIGH (ref 70–99)
Potassium: 3.3 mmol/L — ABNORMAL LOW (ref 3.5–5.1)
Sodium: 136 mmol/L (ref 135–145)
Total Bilirubin: 0.5 mg/dL (ref 0.0–1.2)
Total Protein: 6.1 g/dL — ABNORMAL LOW (ref 6.5–8.1)

## 2024-04-28 LAB — CBC
HCT: 31.7 % — ABNORMAL LOW (ref 36.0–46.0)
Hemoglobin: 10.3 g/dL — ABNORMAL LOW (ref 12.0–15.0)
MCH: 27.3 pg (ref 26.0–34.0)
MCHC: 32.5 g/dL (ref 30.0–36.0)
MCV: 84.1 fL (ref 80.0–100.0)
Platelets: 211 K/uL (ref 150–400)
RBC: 3.77 MIL/uL — ABNORMAL LOW (ref 3.87–5.11)
RDW: 14.3 % (ref 11.5–15.5)
WBC: 9.5 K/uL (ref 4.0–10.5)
nRBC: 0 % (ref 0.0–0.2)

## 2024-04-28 LAB — PREPARE RBC (CROSSMATCH)

## 2024-04-28 LAB — GLUCOSE, CAPILLARY
Glucose-Capillary: 123 mg/dL — ABNORMAL HIGH (ref 70–99)
Glucose-Capillary: 90 mg/dL (ref 70–99)
Glucose-Capillary: 75 mg/dL (ref 70–99)

## 2024-04-28 MED ORDER — INSULIN ASPART 100 UNIT/ML IJ SOLN: 0.0000 [IU] | INTRAMUSCULAR | Status: DC

## 2024-04-28 MED ORDER — SOD CITRATE-CITRIC ACID 500-334 MG/5ML PO SOLN: 30.0000 mL | ORAL | Status: DC | PRN

## 2024-04-28 MED ORDER — LACTATED RINGERS IV SOLN: INTRAVENOUS | Status: DC

## 2024-04-28 MED ORDER — LACTATED RINGERS IV SOLN: 500.0000 mL | INTRAVENOUS | Status: DC | PRN

## 2024-04-28 MED ORDER — EPHEDRINE 5 MG/ML INJ: 10.0000 mg | INTRAVENOUS | Status: DC | PRN

## 2024-04-28 MED ORDER — PHENYLEPHRINE 80 MCG/ML (10ML) SYRINGE FOR IV PUSH (FOR BLOOD PRESSURE SUPPORT)
80.0000 ug | PREFILLED_SYRINGE | INTRAVENOUS | Status: DC | PRN
  Filled 2024-04-28: qty 10

## 2024-04-28 MED ORDER — TERBUTALINE SULFATE 1 MG/ML IJ SOLN: 0.2500 mg | Freq: Once | INTRAMUSCULAR | Status: DC | PRN

## 2024-04-28 MED ORDER — LIDOCAINE HCL (PF) 1 % IJ SOLN: 30.0000 mL | INTRAMUSCULAR | Status: DC | PRN

## 2024-04-28 MED ORDER — FENTANYL CITRATE (PF) 100 MCG/2ML IJ SOLN: 50.0000 ug | INTRAMUSCULAR | Status: DC | PRN

## 2024-04-28 MED ORDER — DIPHENHYDRAMINE HCL 50 MG/ML IJ SOLN: 12.5000 mg | INTRAMUSCULAR | Status: DC | PRN

## 2024-04-28 MED ORDER — OXYTOCIN-SODIUM CHLORIDE 30-0.9 UT/500ML-% IV SOLN
2.5000 [IU]/h | INTRAVENOUS | Status: DC
  Administered 2024-04-29: 2.5 [IU]/h via INTRAVENOUS

## 2024-04-28 MED ORDER — FENTANYL-BUPIVACAINE-NACL 0.5-0.125-0.9 MG/250ML-% EP SOLN
12.0000 mL/h | EPIDURAL | Status: DC | PRN
  Administered 2024-04-29: 12 mL/h via EPIDURAL
  Filled 2024-04-28: qty 250

## 2024-04-28 MED ORDER — LACTATED RINGERS IV SOLN
500.0000 mL | Freq: Once | INTRAVENOUS | Status: AC
  Administered 2024-04-28: 500 mL via INTRAVENOUS

## 2024-04-28 MED ORDER — TRANEXAMIC ACID-NACL 1000-0.7 MG/100ML-% IV SOLN
1000.0000 mg | Freq: Once | INTRAVENOUS | Status: DC
  Filled 2024-04-28: qty 100

## 2024-04-28 MED ORDER — MISOPROSTOL 50MCG HALF TABLET
50.0000 ug | ORAL_TABLET | Freq: Once | ORAL | Status: AC
  Administered 2024-04-28: 50 ug via ORAL
  Filled 2024-04-28: qty 1

## 2024-04-28 MED ORDER — ONDANSETRON HCL 4 MG/2ML IJ SOLN
4.0000 mg | Freq: Four times a day (QID) | INTRAMUSCULAR | Status: DC | PRN
  Administered 2024-04-29: 4 mg via INTRAVENOUS
  Filled 2024-04-28: qty 2

## 2024-04-28 MED ORDER — MISOPROSTOL 25 MCG QUARTER TABLET
25.0000 ug | ORAL_TABLET | Freq: Once | ORAL | Status: AC
  Administered 2024-04-28: 25 ug via VAGINAL
  Filled 2024-04-28: qty 1

## 2024-04-28 MED ORDER — SODIUM CHLORIDE 0.9% IV SOLUTION: Freq: Once | INTRAVENOUS | Status: DC

## 2024-04-28 MED ORDER — OXYTOCIN BOLUS FROM INFUSION
333.0000 mL | Freq: Once | INTRAVENOUS | Status: AC
  Administered 2024-04-29: 333 mL via INTRAVENOUS

## 2024-04-28 MED ORDER — OXYCODONE-ACETAMINOPHEN 5-325 MG PO TABS: 2.0000 | ORAL_TABLET | ORAL | Status: DC | PRN

## 2024-04-28 MED ORDER — PHENYLEPHRINE 80 MCG/ML (10ML) SYRINGE FOR IV PUSH (FOR BLOOD PRESSURE SUPPORT): 80.0000 ug | PREFILLED_SYRINGE | INTRAVENOUS | Status: DC | PRN

## 2024-04-28 MED ORDER — OXYCODONE-ACETAMINOPHEN 5-325 MG PO TABS: 1.0000 | ORAL_TABLET | ORAL | Status: DC | PRN

## 2024-04-28 MED ORDER — METFORMIN HCL 500 MG PO TABS
500.0000 mg | ORAL_TABLET | Freq: Two times a day (BID) | ORAL | Status: DC
  Filled 2024-04-28: qty 1

## 2024-04-28 MED ORDER — ACETAMINOPHEN 325 MG PO TABS: 650.0000 mg | ORAL_TABLET | ORAL | Status: DC | PRN

## 2024-04-28 NOTE — H&P (Addendum)
 OBSTETRIC ADMISSION HISTORY AND PHYSICAL  Ann Hopkins is 23 y.o. G2P1001 with IUP at [redacted]w[redacted]d 05/19/2024, by Ultrasound presenting for IOL for A2GDM-metformin . She received her prenatal care at [facility].   ROS (+) FM, ctx none (-) VB, LOF. HA, visual changes, CP, SOB, RUQ pain, peripheral edema.  Followed by MFM for + Anti Kell antibodies.    Hx 3rd degree lac with 8-6 baby.  Hx PPH w/ transfusion.    Prenatal History/Complications NURSING  PROVIDER  Office Location [facility] Dating by U/S at 7 wks  [facility] Model Traditional Anatomy U/S Normal female 'Ann Hopkins'  Initiated care at  [employer]  English               LAB RESULTS   Support Person   Genetics NIPS: LR female AFP:       NT/IT (FT only) neg      Carrier Screen Horizon:   Rhogam  A/Negative/-- (06/09 1005) 03/16/24  A1C/GTT Early HgbA1C: 5.1 Third trimester 2 hr GTT: abnormal  Flu Vaccine 03/02/24       TDaP Vaccine  03/02/24  Blood Type A/Negative/-- (06/09 1005)  RSV Vaccine   Antibody Positive, See Final Results (08/04 1202)  COVID Vaccine   Rubella 2.64 (06/09 1005)  Feeding Plan breast RPR Non Reactive (06/09 1005)  Contraception Nexp at hosp HBsAg Negative (06/09 1005)  Circumcision yes HIV Non Reactive (06/09 1005)  Pediatrician  [facility] HCVAb Non Reactive (06/09 1005)  Prenatal Classes discussed      BTL Consent   Pap       Diagnosis  Date Value Ref Range Status  12/19/2022     Final    - Negative for intraepithelial lesion or malignancy (NILM)    BTL Pre-payment   GC/CT Initial:  -/- 36wks:    VBAC Consent   GBS   For PCN allergy, check sensitivities   BRx Optimized? [ ]  yes   [ ]  no      DME Rx [ ]  BP cuff [ ]  Weight Scale Waterbirth  [ ]  Class [ ]  Consent [ ]  CNM visit  PHQ9 & GAD7 [  ] new OB [  ] 28 weeks  [  ] 36 weeks Induction  [ ]  Orders Entered [ ] Foley Y/N   OB History  Gravida Para Term Preterm AB Living  2 1 1   1   SAB IAB Ectopic Multiple Live Births      0 1    # Outcome Date GA Lbr Len/2nd Weight Sex Type Anes PTL Lv  2 Current           1 Term 07/11/18 [redacted]w[redacted]d 487:05 / 00:30 3810 g F Vag-Spont EPI  LIV   Patient Active Problem List   Diagnosis Date Noted   Indication for care or intervention related to labor and delivery 04/28/2024   Family history of genetic disorder (father of the baby with son with Willian Romp) 03/05/2024   Gestational diabetes mellitus, class A2 03/03/2024   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   Dermoid cyst 11/10/2023   History of postpartum hemorrhage 11/10/2023   Supervision of high risk pregnancy, antepartum 11/07/2023   Rh negative state in antepartum period 12/10/2017    Past Medical History: Past Medical History:  Diagnosis Date   Blood transfusion without reported diagnosis    2 units packed cells with PPH   Diabetes  mellitus without complication (HCC)    Gestational diabetes    History of postpartum hemorrhage, currently pregnant    Leakage of amniotic fluid 02/17/2024   Low erythrocyte Kell antigen    Medical history non-contributory    Seasonal allergies     Past Surgical History: Past Surgical History:  Procedure Laterality Date   childbirth     NO PAST SURGERIES      Social History Social History   Socioeconomic History   Marital status: Married    Spouse name: Not on file   Number of children: Not on file   Years of education: Not on file   Highest education level: Not on file  Occupational History   Not on file  Tobacco Use   Smoking status: Never    Passive exposure: Never   Smokeless tobacco: Never  Vaping Use   Vaping status: Never Used  Substance and Sexual Activity   Alcohol use: No   Drug use: No   Sexual activity: Yes    Birth control/protection: None  Other Topics Concern   Not on file  Social History Narrative   Not on file   Social Drivers of Health   Financial Resource Strain: Low Risk  (11/10/2023)   Overall Financial Resource Strain (CARDIA)     Difficulty of Paying Living Expenses: Not very hard  Food Insecurity: No Food Insecurity (04/28/2024)   Hunger Vital Sign    Worried About Running Out of Food in the Last Year: Never true    Ran Out of Food in the Last Year: Never true  Transportation Needs: No Transportation Needs (04/28/2024)   PRAPARE - Administrator, Civil Service (Medical): No    Lack of Transportation (Non-Medical): No  Physical Activity: Sufficiently Active (11/10/2023)   Exercise Vital Sign    Days of Exercise per Week: 4 days    Minutes of Exercise per Session: 90 min  Stress: No Stress Concern Present (11/10/2023)   [employer] of Occupational Health - Occupational Stress Questionnaire    Feeling of Stress : Only a little  Social Connections: Unknown (04/28/2024)   Social Connection and Isolation Panel    Frequency of Communication with Friends and Family: More than three times a week    Frequency of Social Gatherings with Friends and Family: Three times a week    Attends Religious Services: More than 4 times per year    Active Member of Clubs or Organizations: No    Attends Engineer, Structural: Not on file    Marital Status: Not on file    Family History: Family History  Problem Relation Age of Onset   Cancer Other    Cancer Maternal Grandfather    Diabetes Maternal Grandfather     Allergies: Allergies  Allergen Reactions   Latex Rash    Medications Prior to Admission  Medication Sig Dispense Refill Last Dose/Taking   metFORMIN  (GLUCOPHAGE ) 500 MG tablet Take 1 tablet (500 mg total) by mouth 2 (two) times daily with a meal. 60 tablet 3 04/28/2024   Accu-Chek Softclix Lancets lancets Use as instructed to check blood sugar 4 times daily 100 each 12    amoxicillin  (AMOXIL ) 875 MG tablet Take 1 tablet (875 mg total) by mouth 2 (two) times daily for 7 days. 14 tablet 0    aspirin  EC 81 MG tablet Take 1 tablet (81 mg total) by mouth daily. Swallow whole. (Patient not taking:  Reported on 04/22/2024) 90 tablet 3  Blood Glucose Monitoring Suppl (ACCU-CHEK GUIDE ME) w/Device KIT 1 each by Does not apply route 4 (four) times daily. 1 kit 0    Blood Pressure Monitoring (BLOOD PRESSURE CUFF) MISC Use as directed to check blood pressure daily (Patient not taking: Reported on 04/22/2024) 1 each 0    Blood Pressure Monitoring (BLOOD PRESSURE CUFF) MISC 1 kit by Does not apply route as needed. 1 each 0    glucose blood test strip Use as instructed to check blood sugar four times daily 100 each 12    ofloxacin  (FLOXIN ) 0.3 % OTIC solution Place 10 drops into the left ear daily for 7 days. 5 mL 0    ondansetron  (ZOFRAN -ODT) 4 MG disintegrating tablet Take 1 tablet (4 mg total) by mouth every 6 (six) hours as needed for nausea. (Patient not taking: Reported on 04/08/2024) 30 tablet 2    polyethylene glycol powder (GLYCOLAX /MIRALAX ) 17 GM/SCOOP powder Take 17 g by mouth daily. Follow directions for Miralax  clean out on AVS. After this use miralax  daily--Dissolve 1 capful (17g) in 4-8 ounces of liquid and take by mouth daily. (Patient not taking: Reported on 04/22/2024) 238 g 1    Prenatal Vit-Fe Fumarate-FA (PRENATAL VITAMINS PO) Take by mouth.      senna (SENOKOT) 8.6 MG TABS tablet Take 1 tablet (8.6 mg total) by mouth daily as needed for mild constipation. (Patient not taking: Reported on 04/22/2024) 120 tablet 0      Review of Systems  All systems reviewed and negative except as stated in HPI  PHYSICAL EXAM Blood pressure 122/62, pulse 84, temperature 98 F (36.7 C), temperature source Oral, last menstrual period 07/31/2023. General appearance: alert and cooperative Lungs: respirations nonlabored Heart: regular rate 84 Abdomen: gravid  Fetal monitoring Baseline: 140 bpm, Variability: Good {> 6 bpm), Accelerations: Reactive, and Decelerations: Absent Uterine activity none  Dilation: 1.5 Effacement (%): 50 Station: -3 Exam by:: Sadie Cleatus Francisca Samuel,  RN Presentation: cephalic   Prenatal labs: ABO, Rh: --/--/PENDING (11/26 1417) Antibody: PENDING (11/26 1417) Rubella: 2.64 (06/09 1005) RPR: Non Reactive (09/30 0911)  HBsAg: Negative (06/09 1005)  HIV: Non Reactive (09/30 0911)   Lab Results  Component Value Date   GBS Negative 04/22/2024    Anatomy US : [redacted]w[redacted]d, Presentation: cephalic, Placenta: Anterior, 8521h (61%)  Immunization History  Administered Date(s) Administered   DTaP 03/25/2000, 05/28/2000, 07/29/2000, 07/28/2001, 01/24/2005   HIB (PRP-OMP) 03/25/2000, 05/28/2000, 07/29/2000, 01/27/2001   Hepatitis B 26[birth year]Dec-1999, 03/25/2000, 07/29/2000   IPV 03/25/2000, 05/28/2000, 07/28/2001, 01/24/2005   Influenza, Seasonal, Injecte, Preservative Fre 03/02/2024   Influenza,inj,Quad PF,6+ Mos 04/09/2018   Influenza-Unspecified 05/05/2012   MMR 01/27/2001, 01/24/2005   Meningococcal Conjugate 01/09/2012   Pneumococcal-Unspecified 03/25/2000, 07/29/2000, 05/28/2001   Rho (D) Immune Globulin  05/20/2018, 03/16/2024   Td 01/09/2012   Tdap 01/09/2012, 10/09/2016, 04/09/2018, 03/02/2024   Tetanus Immune Globulin  10/09/2016   Varicella 01/27/2001    Prenatal Transfer Tool  Maternal Diabetes: Yes:  Diabetes Type:  Insulin /Medication controlled Genetic Screening: Normal Maternal Ultrasounds/Referrals: Normal Fetal Ultrasounds or other Referrals:  None Maternal Substance Abuse:  No Significant Maternal Medications:  No Significant Maternal Lab Results: GBS  - Number of Prenatal Visits:greater than 3 verified prenatal visits Maternal Vaccinations:TDap and Flu Other Comments:  None   Results for orders placed or performed during the hospital encounter of 04/28/24 (from the past 24 hours)  CBC   Collection Time: 04/28/24  1:48 PM  Result Value Ref Range   WBC 9.5 4.0 - 10.5 K/uL  RBC 3.77 (L) 3.87 - 5.11 MIL/uL   Hemoglobin 10.3 (L) 12.0 - 15.0 g/dL   HCT 68.2 (L) 63.9 - 53.9 %   MCV 84.1 80.0 - 100.0 fL   MCH 27.3 26.0 -  34.0 pg   MCHC 32.5 30.0 - 36.0 g/dL   RDW 85.6 88.4 - 84.4 %   Platelets 211 150 - 400 K/uL   nRBC 0.0 0.0 - 0.2 %  Comprehensive metabolic panel   Collection Time: 04/28/24  1:48 PM  Result Value Ref Range   Sodium 136 135 - 145 mmol/L   Potassium 3.3 (L) 3.5 - 5.1 mmol/L   Chloride 107 98 - 111 mmol/L   CO2 19 (L) 22 - 32 mmol/L   Glucose, Bld 112 (H) 70 - 99 mg/dL   BUN 6 6 - 20 mg/dL   Creatinine, Ser 9.53 0.44 - 1.00 mg/dL   Calcium  8.8 (L) 8.9 - 10.3 mg/dL   Total Protein 6.1 (L) 6.5 - 8.1 g/dL   Albumin 2.9 (L) 3.5 - 5.0 g/dL   AST 20 15 - 41 U/L   ALT 14 0 - 44 U/L   Alkaline Phosphatase 104 38 - 126 U/L   Total Bilirubin 0.5 0.0 - 1.2 mg/dL   GFR, Estimated >39 >39 mL/min   Anion gap 10 5 - 15  Glucose, capillary   Collection Time: 04/28/24  2:02 PM  Result Value Ref Range   Glucose-Capillary 123 (H) 70 - 99 mg/dL   Comment 1 Notify RN    Comment 2 Document in Chart   Type and screen [facility]   Collection Time: 04/28/24  2:17 PM  Result Value Ref Range   ABO/RH(D) PENDING    Antibody Screen PENDING    Sample Expiration      05/01/2024,2359 Performed at [facility], 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 G2P1001 with IUP at [redacted]w[redacted]d 05/19/2024, by Ultrasound admitted for IOL for A2GDM-metformin . She received her prenatal care at [facility] .  Sono at [redacted]w[redacted]d: normal anatomy, cephalic  presentation, anterior placenta, EFW 2765g, (76%)  #Labor: Dcyto. Consider FB at next check. #Pain: Per patient preference, encourage ambulation #FWB: Cat 1  #A2GDM: Q4 CBG, continue metformin . SSI PRN.   #GBS status:  negative #Feeding: Breastmilk  #Reproductive Life planning: undecided #Circ:  yes # History of 3rd degree tear: 8lb 6oz # History of PPH - received 2 units # + anti-Kell--2 units PRBCs cross matched. Plan prophylactic  pitocin , TXA at delivery.   Conard Me, SNM, RNC-OB Student Nurse Midwife 04/28/2024 3:44 PM   I was present for the exam and agree with above.  Claudene, Shaunte Weissinger , CNM 04/28/2024 9:20 PM

## 2024-04-28 NOTE — Progress Notes (Signed)
 Labor Progress Note [name] is a 23 y.o. G2P1001 at [redacted]w[redacted]d presenting for IOL for A2GDM (metformin ).   S: Feeling very uncomfortable with contractions  O:  BP 122/80   Pulse 91   Temp 98.3 F (36.8 C) (Oral)   LMP 07/31/2023 (Exact Date)  Lab Results  Component Value Date   HGB 10.3 (L) 04/28/2024    Time: 9:28 PM  FHT: baseline bpm 140, moderate variability, accelerations present, decelerations none,   Contractions: q every 1 min mins,    CVE: Dilation: 3 Effacement (%): 90 Cervical Position: Posterior Station: -3 Presentation: Vertex Exam by:: Dr. [name]   A&P: 23 y.o. G2P1001 [redacted]w[redacted]d IOL for GDMA2 #Labor: Latent Labor Cytotec --plan for AROM after epidural #Pain: desires Epidural--place before AROM #FWB: Category I #GBS negative #A2GDM: glucose at goal. ISS. Metformin  500mg  BID   [name] [name], MD 9:28 PM

## 2024-04-29 ENCOUNTER — Inpatient Hospital Stay (HOSPITAL_COMMUNITY): Admitting: Anesthesiology

## 2024-04-29 ENCOUNTER — Encounter (HOSPITAL_COMMUNITY): Payer: Self-pay | Admitting: Family Medicine

## 2024-04-29 DIAGNOSIS — Z3A37 37 weeks gestation of pregnancy: Secondary | ICD-10-CM

## 2024-04-29 DIAGNOSIS — O24424 Gestational diabetes mellitus in childbirth, insulin controlled: Secondary | ICD-10-CM

## 2024-04-29 LAB — GLUCOSE, CAPILLARY
Glucose-Capillary: 87 mg/dL (ref 70–99)
Glucose-Capillary: 87 mg/dL (ref 70–99)

## 2024-04-29 LAB — SYPHILIS: RPR W/REFLEX TO RPR TITER AND TREPONEMAL ANTIBODIES, TRADITIONAL SCREENING AND DIAGNOSIS ALGORITHM: RPR Ser Ql: NONREACTIVE

## 2024-04-29 MED ORDER — SENNOSIDES-DOCUSATE SODIUM 8.6-50 MG PO TABS
2.0000 | ORAL_TABLET | ORAL | Status: DC
  Administered 2024-04-29 – 2024-04-30 (×2): 2 via ORAL
  Filled 2024-04-29 (×2): qty 2

## 2024-04-29 MED ORDER — WITCH HAZEL-GLYCERIN EX PADS: 1.0000 | MEDICATED_PAD | CUTANEOUS | Status: DC | PRN

## 2024-04-29 MED ORDER — COCONUT OIL OIL: 1.0000 | TOPICAL_OIL | Status: DC | PRN

## 2024-04-29 MED ORDER — DIBUCAINE (PERIANAL) 1 % EX OINT: 1.0000 | TOPICAL_OINTMENT | CUTANEOUS | Status: DC | PRN

## 2024-04-29 MED ORDER — ONDANSETRON HCL 4 MG/2ML IJ SOLN: 4.0000 mg | INTRAMUSCULAR | Status: DC | PRN

## 2024-04-29 MED ORDER — TERBUTALINE SULFATE 1 MG/ML IJ SOLN: 0.2500 mg | Freq: Once | INTRAMUSCULAR | Status: DC | PRN

## 2024-04-29 MED ORDER — FERROUS SULFATE 325 (65 FE) MG PO TABS
325.0000 mg | ORAL_TABLET | ORAL | Status: DC
  Administered 2024-04-29: 325 mg via ORAL
  Filled 2024-04-29: qty 1

## 2024-04-29 MED ORDER — FLEET ENEMA RE ENEM: 1.0000 | ENEMA | Freq: Every day | RECTAL | Status: DC | PRN

## 2024-04-29 MED ORDER — SIMETHICONE 80 MG PO CHEW
80.0000 mg | CHEWABLE_TABLET | ORAL | Status: DC | PRN
  Administered 2024-04-30: 80 mg via ORAL
  Filled 2024-04-29: qty 1

## 2024-04-29 MED ORDER — BISACODYL 10 MG RE SUPP: 10.0000 mg | Freq: Every day | RECTAL | Status: DC | PRN

## 2024-04-29 MED ORDER — METHYLERGONOVINE MALEATE 0.2 MG/ML IJ SOLN: 0.2000 mg | INTRAMUSCULAR | Status: DC | PRN

## 2024-04-29 MED ORDER — DIPHENHYDRAMINE HCL 25 MG PO CAPS: 25.0000 mg | ORAL_CAPSULE | Freq: Four times a day (QID) | ORAL | Status: DC | PRN

## 2024-04-29 MED ORDER — OXYCODONE HCL 5 MG PO TABS: 5.0000 mg | ORAL_TABLET | ORAL | Status: DC | PRN

## 2024-04-29 MED ORDER — IBUPROFEN 600 MG PO TABS
600.0000 mg | ORAL_TABLET | Freq: Four times a day (QID) | ORAL | Status: DC
  Administered 2024-04-29 – 2024-04-30 (×5): 600 mg via ORAL
  Filled 2024-04-29 (×5): qty 1

## 2024-04-29 MED ORDER — METHYLERGONOVINE MALEATE 0.2 MG PO TABS: 0.2000 mg | ORAL_TABLET | ORAL | Status: DC | PRN

## 2024-04-29 MED ORDER — LIDOCAINE HCL (PF) 1 % IJ SOLN
INTRAMUSCULAR | Status: DC | PRN
  Administered 2024-04-29: 10 mL via EPIDURAL

## 2024-04-29 MED ORDER — OXYTOCIN-SODIUM CHLORIDE 30-0.9 UT/500ML-% IV SOLN
1.0000 m[IU]/min | INTRAVENOUS | Status: DC
  Administered 2024-04-29: 2 m[IU]/min via INTRAVENOUS
  Filled 2024-04-29: qty 500

## 2024-04-29 MED ORDER — TETANUS-DIPHTH-ACELL PERTUSSIS 5-2-15.5 LF-MCG/0.5 IM SUSP: 0.5000 mL | Freq: Once | INTRAMUSCULAR | Status: DC

## 2024-04-29 MED ORDER — ONDANSETRON HCL 4 MG PO TABS: 4.0000 mg | ORAL_TABLET | ORAL | Status: DC | PRN

## 2024-04-29 MED ORDER — PRENATAL MULTIVITAMIN CH
1.0000 | ORAL_TABLET | Freq: Every day | ORAL | Status: DC
  Administered 2024-04-29 – 2024-04-30 (×2): 1 via ORAL
  Filled 2024-04-29 (×2): qty 1

## 2024-04-29 MED ORDER — MEASLES, MUMPS & RUBELLA VAC ~~LOC~~ SUSR: 0.5000 mL | Freq: Once | SUBCUTANEOUS | Status: DC

## 2024-04-29 MED ORDER — RHO D IMMUNE GLOBULIN 1500 UNIT/2ML IJ SOSY: 300.0000 ug | PREFILLED_SYRINGE | Freq: Once | INTRAMUSCULAR | Status: DC

## 2024-04-29 MED ORDER — MEDROXYPROGESTERONE ACETATE 150 MG/ML IM SUSP: 150.0000 mg | INTRAMUSCULAR | Status: DC | PRN

## 2024-04-29 MED ORDER — BENZOCAINE-MENTHOL 20-0.5 % EX AERO: 1.0000 | INHALATION_SPRAY | CUTANEOUS | Status: DC | PRN

## 2024-04-29 MED ORDER — ACETAMINOPHEN 325 MG PO TABS
650.0000 mg | ORAL_TABLET | ORAL | Status: DC | PRN
  Administered 2024-04-29 – 2024-04-30 (×5): 650 mg via ORAL
  Filled 2024-04-29 (×5): qty 2

## 2024-04-29 NOTE — Anesthesia Postprocedure Evaluation (Signed)
 Anesthesia Post Note  Patient: Ann Hopkins  Procedure(s) Performed: AN AD HOC LABOR EPIDURAL     Patient location during evaluation: Mother Baby Anesthesia Type: Epidural Level of consciousness: awake Pain management: satisfactory to patient Vital Signs Assessment: post-procedure vital signs reviewed and stable Respiratory status: spontaneous breathing Cardiovascular status: stable Anesthetic complications: no   No notable events documented.  Last Vitals:  Vitals:   04/29/24 1052 04/29/24 1201  BP: 107/61 (!) 110/58  Pulse: 68 72  Resp: 18 18  Temp: 36.8 C 36.9 C  SpO2: 98% 98%    Last Pain:  Vitals:   04/29/24 1202  TempSrc:   PainSc: 0-No pain   Pain Goal:                   [employer]

## 2024-04-29 NOTE — Discharge Summary (Signed)
 Postpartum Discharge Summary     Patient Name: Ann Hopkins DOB: 07/14/2000 MRN: [id number]  Date of admission: 04/28/2024 Delivery date:04/29/2024 Delivering provider: GLENDIA KNEE Date of discharge: 04/30/2024  Admitting diagnosis: Indication for care or intervention related to labor and delivery [O75.9] Intrauterine pregnancy: [redacted]w[redacted]d     Secondary diagnosis:  Principal Problem:   Indication for care or intervention related to labor and delivery Active Problems:   Rh negative state in antepartum period   History of postpartum hemorrhage   Gestational diabetes mellitus, class A2   Vaginal delivery  Additional problems: None    Discharge diagnosis: Term Pregnancy Delivered and GDM A2                                              Post partum procedures:None Augmentation: AROM, Pitocin , and Cytotec  Complications: None  Hospital course: Induction of Labor With Vaginal Delivery   23 y.o. yo [id number] at [redacted]w[redacted]d was admitted to the hospital 04/28/2024 for induction of labor.  Indication for induction: A2 DM.  Patient had an labor course complicated by nothing Membrane Rupture Time/Date: 1:28 AM,04/29/2024  Delivery Method:Vaginal, Spontaneous Operative Delivery:N/A Episiotomy: None Lacerations:  None Details of delivery can be found in separate delivery note.  Patient had a postpartum course that was uncomplicated. Patient was started on oral iron therapy for clinically significant but asymptomatic acute post delivery anemia due to expected blood loss. Patient is discharged home 04/30/24.  Newborn Data: Birth date:04/29/2024 Birth time:8:24 AM Gender:Female Living status:Living Apgars:9 ,9  Weight:3260 g  Magnesium Sulfate received: No BMZ received: No Rhophylac :No MMR:N/A T-DaP:Given prenatally Flu: N/A RSV Vaccine received: No Transfusion:No  Immunizations received: Immunization History  Administered Date(s) Administered   DTaP 03/25/2000, 05/28/2000, 07/29/2000,  07/28/2001, 01/24/2005   HIB (PRP-OMP) 03/25/2000, 05/28/2000, 07/29/2000, 01/27/2001   Hepatitis B 03/06/00, 03/25/2000, 07/29/2000   IPV 03/25/2000, 05/28/2000, 07/28/2001, 01/24/2005   Influenza, Seasonal, Injecte, Preservative Fre 03/02/2024   Influenza,inj,Quad PF,6+ Mos 04/09/2018   Influenza-Unspecified 05/05/2012   MMR 01/27/2001, 01/24/2005   Meningococcal Conjugate 01/09/2012   Pneumococcal-Unspecified 03/25/2000, 07/29/2000, 05/28/2001   Rho (D) Immune Globulin  05/20/2018, 03/16/2024   Td 01/09/2012   Tdap 01/09/2012, 10/09/2016, 04/09/2018, 03/02/2024   Tetanus Immune Globulin  10/09/2016   Varicella 01/27/2001    Physical exam  Vitals:   04/29/24 1931 04/29/24 2349 04/30/24 0512 04/30/24 1327  BP: 120/77 122/81 (!) 108/53 127/80  Pulse: 64 74 69 96  Resp: 18 18 18 18   Temp: 98 F (36.7 C) 98 F (36.7 C) 98 F (36.7 C) 98.4 F (36.9 C)  TempSrc: Oral Oral Oral Oral  SpO2: 100% 99% 99%    General: alert, cooperative, and no distress Lochia: appropriate Uterine Fundus: firm Incision: N/A DVT Evaluation: No evidence of DVT seen on physical exam. Negative Homan's sign. No cords or calf tenderness. No significant calf/ankle edema. Labs: Lab Results  Component Value Date   WBC 9.5 04/28/2024   HGB 10.3 (L) 04/28/2024   HCT 31.7 (L) 04/28/2024   MCV 84.1 04/28/2024   PLT 211 04/28/2024      Latest Ref Rng & Units 04/28/2024    1:48 PM  CMP  Glucose 70 - 99 mg/dL 887   BUN 6 - 20 mg/dL 6   Creatinine 9.55 - 8.99 mg/dL 9.53   Sodium 864 - 854 mmol/L 136   Potassium 3.5 - 5.1 mmol/L  3.3   Chloride 98 - 111 mmol/L 107   CO2 22 - 32 mmol/L 19   Calcium  8.9 - 10.3 mg/dL 8.8   Total Protein 6.5 - 8.1 g/dL 6.1   Total Bilirubin 0.0 - 1.2 mg/dL 0.5   Alkaline Phos 38 - 126 U/L 104   AST 15 - 41 U/L 20   ALT 0 - 44 U/L 14    Edinburgh Score:    07/13/2018    9:00 AM  Edinburgh Postnatal Depression Scale Screening Tool  I have been able to laugh and  see the funny side of things.   I have looked forward with enjoyment to things.   I have blamed myself unnecessarily when things went wrong.   I have been anxious or worried for no good reason.   I have felt scared or panicky for no good reason.   Things have been getting on top of me.   I have been so unhappy that I have had difficulty sleeping.   I have felt sad or miserable.   I have been so unhappy that I have been crying.   The thought of harming myself has occurred to me.   Edinburgh Postnatal Depression Scale Total      Information is confidential and restricted. Go to Review Flowsheets to unlock data.   No data recorded  After visit meds:  Allergies as of 04/30/2024       Reactions   Latex Rash        Medication List     STOP taking these medications    Accu-Chek Guide Me w/Device Kit   Accu-Chek Softclix Lancets lancets   amoxicillin  875 MG tablet Commonly known as: AMOXIL    aspirin  EC 81 MG tablet   Blood Pressure Cuff Misc   glucose blood test strip   metFORMIN  500 MG tablet Commonly known as: GLUCOPHAGE    ofloxacin  0.3 % OTIC solution Commonly known as: FLOXIN    ondansetron  4 MG disintegrating tablet Commonly known as: ZOFRAN -ODT   senna 8.6 MG Tabs tablet Commonly known as: SENOKOT       TAKE these medications    acetaminophen  325 MG tablet Commonly known as: Tylenol  Take 2 tablets (650 mg total) by mouth every 4 (four) hours as needed (for pain scale < 4).   benzocaine -Menthol  20-0.5 % Aero Commonly known as: DERMOPLAST Apply 1 Application topically as needed for irritation (perineal discomfort).   coconut oil Oil Apply 1 Application topically as needed.   dibucaine 1 % Oint Commonly known as: NUPERCAINAL Place 1 Application rectally as needed for hemorrhoids.   ferrous sulfate  325 (65 FE) MG tablet Take 1 tablet (325 mg total) by mouth every other day. Start taking on: May 01, 2024   ibuprofen  600 MG tablet Commonly  known as: ADVIL  Take 1 tablet (600 mg total) by mouth every 6 (six) hours.   polyethylene glycol powder 17 GM/SCOOP powder Commonly known as: GLYCOLAX /MIRALAX  Take 17 g by mouth daily. Follow directions for Miralax  clean out on AVS. After this use miralax  daily--Dissolve 1 capful (17g) in 4-8 ounces of liquid and take by mouth daily.   PRENATAL VITAMINS PO Take by mouth.   witch hazel-glycerin  pad Commonly known as: TUCKS Apply 1 Application topically as needed for hemorrhoids.         Discharge home in stable condition Infant Feeding: Breast Infant Disposition:home with mother Discharge instruction: per After Visit Summary and Postpartum booklet. Activity: Advance as tolerated. Pelvic rest for 6 weeks.  Diet: routine diet Future Appointments:No future appointments. Follow up Visit:  Message sent 11/28 to FT   Please schedule this patient for a In person postpartum visit in 4 weeks with the following provider: Any provider. Additional Postpartum F/U:2 hour GTT  Low risk pregnancy complicated by: GDM Delivery mode:  Vaginal, Spontaneous Anticipated Birth Control:  Plans Interval BTL    04/30/2024 Charlie DELENA Courts, MD

## 2024-04-29 NOTE — Lactation Note (Signed)
 This note was copied from a baby's chart. Lactation Consultation Note  Patient Name: Ann Hopkins Date: 04/29/2024 Age:24 hours Reason for consult: Initial assessment;Early term 37-38.6wks P3, ETI, MOB attempted latch infant on her left breast using the football hold, infant briefly latched for 3 minutes and became sleepy. MOB expressed 12 mls of colostrum using hand pump, colostrum was spoon fed to infant. MOB will continue to work on latching infant at the breast, will continue to breastfeed infant by cues, on demand, 8-12 times within 24 hours, skin to skin. MOB knows to call for further latch assistance if needed. MOB plans to continue to use the hand pump and give infant back her EBM if infant does not latch at the breast. MOB knows to call for further latch assistance if needed. MOB was  made aware of O/P services, breastfeeding support groups, community resources, and our phone # for post-discharge questions.    Maternal Data Has patient been taught Hand Expression?: Yes Does the patient have breastfeeding experience prior to this delivery?: Yes How long did the patient breastfeed?: MOB breastfeed her 2nd child for 7 months  Feeding Mother's Current Feeding Choice: Breast Milk  LATCH Score Latch: Too sleepy or reluctant, no latch achieved, no sucking elicited.  Audible Swallowing: A few with stimulation  Type of Nipple: Everted at rest and after stimulation  Comfort (Breast/Nipple): Soft / non-tender  Hold (Positioning): Assistance needed to correctly position infant at breast and maintain latch.  LATCH Score: 6   Lactation Tools Discussed/Used    Interventions Interventions: Breast feeding basics reviewed;Assisted with latch;Skin to skin;Breast compression;Adjust position;Support pillows;Position options;Hand express;Expressed milk;Hand pump;Education;CDC milk storage guidelines;CDC Guidelines for Breast Pump Cleaning;LC Services brochure;Guidelines for Milk  Supply and Pumping Schedule Handout  Discharge Pump: DEBP;Manual;Personal  Consult Status Consult Status: Follow-up Date: 04/30/24 Follow-up type: In-patient    Ann Hopkins 04/29/2024, 7:20 PM

## 2024-04-29 NOTE — Progress Notes (Signed)
 Labor Progress Note Ann Hopkins is a 23 y.o. G2P1001 at [redacted]w[redacted]d presenting for IOL for A2GDM  S: Now comfortable with epidural. Agreeable to AROM. Was having regular intense 10/10 ctx prior to epidural.  O:  BP 111/70   Pulse 81   Temp 98.1 F (36.7 C) (Oral)   LMP 07/31/2023 (Exact Date)   SpO2 96%  Lab Results  Component Value Date   HGB 10.3 (L) 04/28/2024    CVE: Dilation: 4 Effacement (%): 60 Cervical Position: Posterior Station: -3 Presentation: Vertex Exam by:: Dr. [name]   A&P: 23 y.o. G2P1001 [redacted]w[redacted]d  #Labor: Latent Labor AROM and Cytotec , Pitocin  ordered to start if ctx space out #Pain: Epidural #FWB: Category I #GBS negative #A2GDM: BG at goal, metformin  500mg  BID, ISS PRN #Rh-, +anti-Kell: fetus is anti-Kell and RhD negative on Unity testing  [name] [name], MD 1:33 AM

## 2024-04-29 NOTE — Anesthesia Procedure Notes (Signed)
 Epidural Patient location during procedure: OB Start time: 04/29/2024 12:45 AM End time: 04/29/2024 12:55 AM  Staffing Anesthesiologist: Niels Marien CROME, MD Performed: anesthesiologist   Preanesthetic Checklist Completed: patient identified, IV checked, risks and benefits discussed, monitors and equipment checked, pre-op evaluation and timeout performed  Epidural Patient position: sitting Prep: DuraPrep and site prepped and draped Patient monitoring: continuous pulse ox, blood pressure, heart rate and cardiac monitor Approach: midline Location: L3-L4 Injection technique: LOR air  Needle:  Needle type: Tuohy  Needle gauge: 17 G Needle length: 9 cm Needle insertion depth: 5 cm Catheter type: closed end flexible Catheter size: 19 Gauge Catheter at skin depth: 10 cm Test dose: negative  Assessment Sensory level: T8 Events: blood not aspirated, no cerebrospinal fluid, injection not painful, no injection resistance, no paresthesia and negative IV test  Additional Notes Patient identified. Risks/Benefits/Options discussed with patient including but not limited to bleeding, infection, nerve damage, paralysis, failed block, incomplete pain control, headache, blood pressure changes, nausea, vomiting, reactions to medication both or allergic, itching and postpartum back pain. Confirmed with bedside nurse the patient's most recent platelet count. Confirmed with patient that they are not currently taking any anticoagulation, have any bleeding history or any family history of bleeding disorders. Patient expressed understanding and wished to proceed. All questions were answered. Sterile technique was used throughout the entire procedure. Please see nursing notes for vital signs. Test dose was given through epidural catheter and negative prior to continuing to dose epidural or start infusion. Warning signs of high block given to the patient including shortness of breath, tingling/numbness in  hands, complete motor block, or any concerning symptoms with instructions to call for help. Patient was given instructions on fall risk and not to get out of bed. All questions and concerns addressed with instructions to call with any issues or inadequate analgesia.  Reason for block:procedure for pain

## 2024-04-29 NOTE — Anesthesia Preprocedure Evaluation (Signed)
 Anesthesia Evaluation  Patient identified by MRN, date of birth, ID band Patient awake    Reviewed: Allergy & Precautions, NPO status , Patient's Chart, lab work & pertinent test results  Airway Mallampati: II  TM Distance: >3 FB Neck ROM: Full    Dental no notable dental hx. (+) Teeth Intact, Dental Advisory Given   Pulmonary neg pulmonary ROS   Pulmonary exam normal breath sounds clear to auscultation       Cardiovascular negative cardio ROS Normal cardiovascular exam Rhythm:Regular Rate:Normal     Neuro/Psych negative neurological ROS  negative psych ROS   GI/Hepatic negative GI ROS, Neg liver ROS,,,  Endo/Other  diabetes, Gestational, Oral Hypoglycemic Agents    Renal/GU negative Renal ROS  negative genitourinary   Musculoskeletal negative musculoskeletal ROS (+)    Abdominal   Peds  Hematology negative hematology ROS (+)   Anesthesia Other Findings IOL for gDM  Reproductive/Obstetrics (+) Pregnancy                              Anesthesia Physical Anesthesia Plan  ASA: 3  Anesthesia Plan: Epidural   Post-op Pain Management:    Induction:   PONV Risk Score and Plan: Treatment may vary due to age or medical condition  Airway Management Planned: Natural Airway  Additional Equipment:   Intra-op Plan:   Post-operative Plan:   Informed Consent: I have reviewed the patients History and Physical, chart, labs and discussed the procedure including the risks, benefits and alternatives for the proposed anesthesia with the patient or authorized representative who has indicated his/her understanding and acceptance.       Plan Discussed with: Anesthesiologist  Anesthesia Plan Comments: (Patient identified. Risks, benefits, options discussed with patient including but not limited to bleeding, infection, nerve damage, paralysis, failed block, incomplete pain control, headache,  blood pressure changes, nausea, vomiting, reactions to medication, itching, and post partum back pain. Confirmed with bedside nurse the patient's most recent platelet count. Confirmed with the patient that they are not taking any anticoagulation, have any bleeding history or any family history of bleeding disorders. Patient expressed understanding and wishes to proceed. All questions were answered. )        Anesthesia Quick Evaluation

## 2024-04-30 MED ORDER — BENZOCAINE-MENTHOL 20-0.5 % EX AERO: 1.0000 | INHALATION_SPRAY | CUTANEOUS | Status: AC | PRN

## 2024-04-30 MED ORDER — FERROUS SULFATE 325 (65 FE) MG PO TABS: 325.0000 mg | ORAL_TABLET | ORAL | 0 refills | Status: AC

## 2024-04-30 MED ORDER — DIBUCAINE (PERIANAL) 1 % EX OINT: 1.0000 | TOPICAL_OINTMENT | CUTANEOUS | Status: AC | PRN

## 2024-04-30 MED ORDER — IBUPROFEN 600 MG PO TABS: 600.0000 mg | ORAL_TABLET | Freq: Four times a day (QID) | ORAL | Status: AC

## 2024-04-30 MED ORDER — COCONUT OIL OIL: 1.0000 | TOPICAL_OIL | Status: AC | PRN

## 2024-04-30 MED ORDER — WITCH HAZEL-GLYCERIN EX PADS: 1.0000 | MEDICATED_PAD | CUTANEOUS | Status: AC | PRN

## 2024-04-30 MED ORDER — ACETAMINOPHEN 325 MG PO TABS: 650.0000 mg | ORAL_TABLET | ORAL | Status: AC | PRN

## 2024-04-30 NOTE — Lactation Note (Signed)
 This note was copied from a baby's chart. Lactation Consultation Note  Patient Name: Ann Hopkins Date: 04/30/2024 Age:24 hours  Reason for consult: Follow-up assessment;Early term 37-38.6wks;RN request  P2, [redacted]w[redacted]d, 3% weight loss  Follow up LC visit. Mother reports baby will latch but was falling asleep before feeding well. Mother was set up on a DEBP by her nurse and collected 8 mL which she fed to her baby. Mother also requested to formula feed after breastfeeding.   Baby had his first, bottle fed well, and is more alert. Mother has experience breastfeeding and denies any further concerns. She is a ware of early term infant  feeding behaviors and plans to resume breastfeeding, pumping and supplementing with expressed milk or formula until baby is latching consistently and gaining weight.  Mom made aware of O/P services, breastfeeding support groups, community resources, and phone # for post-discharge questions.       Feeding Mother's Current Feeding Choice: Breast Milk and Formula   Lactation Tools Discussed/Used Tools: Pump Breast pump type: Double-Electric Breast Pump Pump Education: Setup, frequency, and cleaning;Milk Storage Reason for Pumping: stimulate breast, sleepy baby Pumping frequency: set up by RN Pumped volume: 8 mL  Interventions Interventions: Education  Discharge Discharge Education: Engorgement and breast care;Warning signs for feeding baby Pump: Personal;Manual;DEBP  Consult Status Consult Status: Complete Date: 04/30/24    Ann Hopkins 04/30/2024, 2:18 PM

## 2024-04-30 NOTE — Progress Notes (Signed)
 Post Partum Day 1 Subjective: no complaints, up ad lib, voiding and tolerating PO, small lochia, plans to breastfeed, wants inpt nexplanon.  Undecided if wants DC today--baby may need urology consult, so will wait until pediatrician comes to decide, leaning toward DC tomorrow  Objective: Blood pressure (!) 108/53, pulse 69, temperature 98 F (36.7 C), temperature source Oral, resp. rate 18, last menstrual period 07/31/2023, SpO2 99%, unknown if currently breastfeeding.  Physical Exam:  General: alert, cooperative and no distress Lochia:normal flow Chest: CTAB Heart: RRR no m/r/g Abdomen: +BS, soft, nontender,  Uterine Fundus: firm DVT Evaluation: No evidence of DVT seen on physical exam. Extremities: trace edema  Recent Labs    04/28/24 1348  HGB 10.3*  HCT 31.7*    Assessment/Plan: Breastfeeding and Contraception plan in pt Nexplanon   LOS: 2 days   Cathlean Ely 04/30/2024, 7:21 AM

## 2024-04-30 NOTE — Patient Instructions (Signed)
 If you are interested in an outpatient lactation consultation -- available in-office or virtually -- please reach out to us  at:  [facility] (First Floor) ?? [address], [place], [place]  ?? [phone] Please leave a message on our lactation voicemail box. We welcome any lactation-related questions or concerns -- our team is here to support you and your baby.  Lactation Support Groups Join us  at: [facility] ?? Tuesdays, 10:00 AM - 12:00 PM ?? [address], [place] [employer], [place]  Lactating parents and lap babies are welcome!  ?? [email or web address]  ?? [email or web address] -------------     Ann Hopkins, IBCLC [facility]

## 2024-05-02 LAB — TYPE AND SCREEN
ABO/RH(D): A NEG
Antibody Screen: POSITIVE
Donor AG Type: NEGATIVE
Donor AG Type: NEGATIVE
Unit division: 0
Unit division: 0

## 2024-05-02 LAB — BPAM RBC
Blood Product Expiration Date: 202512212359
Blood Product Expiration Date: 202512212359
Unit Type and Rh: 600
Unit Type and Rh: 600

## 2024-05-03 ENCOUNTER — Other Ambulatory Visit

## 2024-05-06 ENCOUNTER — Encounter: Admitting: Obstetrics & Gynecology

## 2024-05-06 ENCOUNTER — Other Ambulatory Visit

## 2024-05-10 ENCOUNTER — Encounter: Payer: Self-pay | Admitting: Obstetrics & Gynecology

## 2024-05-10 ENCOUNTER — Other Ambulatory Visit

## 2024-05-11 ENCOUNTER — Telehealth (HOSPITAL_COMMUNITY): Payer: Self-pay | Admitting: *Deleted

## 2024-05-11 ENCOUNTER — Other Ambulatory Visit

## 2024-05-11 NOTE — Telephone Encounter (Signed)
 05/11/2024  Name: Ann Hopkins MRN: [id number] DOB: 2000-12-02  Reason for Call:  Transition of Care Hospital Discharge Call  Contact Status: Patient Contact Status: Unable to contact (voice mail box full, unable to leave a message)  Language assistant needed:          Follow-Up Questions:    Van Postnatal Depression Scale:  In the Past 7 Days:    PHQ2-9 Depression Scale:     Discharge Follow-up:    Post-discharge interventions: NA  Mliss Sieve, RN 05/11/2024 12:06

## 2024-05-13 ENCOUNTER — Encounter: Admitting: Obstetrics & Gynecology

## 2024-05-13 ENCOUNTER — Other Ambulatory Visit

## 2024-05-14 ENCOUNTER — Encounter: Admitting: Obstetrics & Gynecology

## 2024-05-14 ENCOUNTER — Other Ambulatory Visit

## 2024-05-17 ENCOUNTER — Other Ambulatory Visit

## 2024-05-20 ENCOUNTER — Other Ambulatory Visit

## 2024-05-24 ENCOUNTER — Encounter: Payer: Self-pay | Admitting: *Deleted

## 2024-06-10 ENCOUNTER — Ambulatory Visit: Admitting: Women's Health

## 2024-06-10 ENCOUNTER — Other Ambulatory Visit (INDEPENDENT_AMBULATORY_CARE_PROVIDER_SITE_OTHER)

## 2024-06-10 ENCOUNTER — Encounter: Payer: Self-pay | Admitting: Advanced Practice Midwife

## 2024-06-10 DIAGNOSIS — O24419 Gestational diabetes mellitus in pregnancy, unspecified control: Secondary | ICD-10-CM

## 2024-06-10 DIAGNOSIS — Z8632 Personal history of gestational diabetes: Secondary | ICD-10-CM

## 2024-06-17 ENCOUNTER — Ambulatory Visit: Admitting: Advanced Practice Midwife

## 2024-06-17 ENCOUNTER — Other Ambulatory Visit

## 2024-06-17 DIAGNOSIS — Z5320 Procedure and treatment not carried out because of patient's decision for unspecified reasons: Secondary | ICD-10-CM

## 2024-06-17 NOTE — Progress Notes (Signed)
    Error, No show
